# Patient Record
Sex: Male | Born: 1965
Health system: Southern US, Community
[De-identification: ages and names within clinical notes are randomized; demographics above are authoritative.]

## PROBLEM LIST (undated history)

## (undated) DIAGNOSIS — K219 Gastro-esophageal reflux disease without esophagitis: Secondary | ICD-10-CM

## (undated) DIAGNOSIS — Z889 Allergy status to unspecified drugs, medicaments and biological substances status: Secondary | ICD-10-CM

## (undated) HISTORY — DX: Allergy status to unspecified drugs, medicaments and biological substances: Z88.9

## (undated) HISTORY — DX: Gastro-esophageal reflux disease without esophagitis: K21.9

## (undated) HISTORY — PX: WISDOM TOOTH EXTRACTION: SHX21

---

## 2000-08-14 ENCOUNTER — Encounter: Payer: Self-pay | Admitting: Orthopedic Surgery

## 2000-08-14 ENCOUNTER — Ambulatory Visit (HOSPITAL_COMMUNITY): Admission: RE | Admit: 2000-08-14 | Discharge: 2000-08-14 | Payer: Self-pay | Admitting: Orthopedic Surgery

## 2004-02-22 ENCOUNTER — Ambulatory Visit: Payer: Self-pay | Admitting: Family Medicine

## 2005-04-23 ENCOUNTER — Ambulatory Visit: Payer: Self-pay | Admitting: Family Medicine

## 2005-05-05 ENCOUNTER — Ambulatory Visit: Payer: Self-pay | Admitting: Family Medicine

## 2006-02-12 ENCOUNTER — Ambulatory Visit: Payer: Self-pay | Admitting: Family Medicine

## 2006-06-14 ENCOUNTER — Ambulatory Visit: Payer: Self-pay | Admitting: Family Medicine

## 2007-01-28 ENCOUNTER — Ambulatory Visit: Payer: Self-pay | Admitting: Family Medicine

## 2007-03-30 ENCOUNTER — Ambulatory Visit: Payer: Self-pay | Admitting: Family Medicine

## 2007-03-30 LAB — CONVERTED CEMR LAB
Beta hcg, urine, semiquantitative: NEGATIVE
Bilirubin Urine: NEGATIVE
Blood in Urine, dipstick: NEGATIVE
Glucose, Urine, Semiquant: NEGATIVE
Ketones, urine, test strip: NEGATIVE
Nitrite: NEGATIVE
Protein, U semiquant: NEGATIVE
Specific Gravity, Urine: 1.025
Urobilinogen, UA: NEGATIVE
WBC Urine, dipstick: NEGATIVE
pH: 6

## 2007-04-01 LAB — CONVERTED CEMR LAB
ALT: 31 units/L (ref 0–53)
AST: 23 units/L (ref 0–37)
Albumin: 4 g/dL (ref 3.5–5.2)
Alkaline Phosphatase: 61 units/L (ref 39–117)
BUN: 12 mg/dL (ref 6–23)
Basophils Absolute: 0 10*3/uL (ref 0.0–0.1)
Basophils Relative: 0.2 % (ref 0.0–1.0)
Bilirubin, Direct: 0.2 mg/dL (ref 0.0–0.3)
CO2: 32 meq/L (ref 19–32)
Calcium: 9.6 mg/dL (ref 8.4–10.5)
Chloride: 104 meq/L (ref 96–112)
Cholesterol: 194 mg/dL (ref 0–200)
Creatinine, Ser: 1.1 mg/dL (ref 0.4–1.5)
Eosinophils Absolute: 0.3 10*3/uL (ref 0.0–0.6)
Eosinophils Relative: 5 % (ref 0.0–5.0)
GFR calc Af Amer: 94 mL/min
GFR calc non Af Amer: 78 mL/min
Glucose, Bld: 114 mg/dL — ABNORMAL HIGH (ref 70–99)
HCT: 45.2 % (ref 39.0–52.0)
HDL: 58.8 mg/dL (ref >39.0)
Hemoglobin: 15.5 g/dL (ref 13.0–17.0)
LDL Cholesterol: 122 mg/dL — ABNORMAL HIGH (ref 0–99)
Lymphocytes Relative: 31.7 % (ref 12.0–46.0)
MCHC: 34.3 g/dL (ref 30.0–36.0)
MCV: 89.7 fL (ref 78.0–100.0)
Monocytes Absolute: 0.4 10*3/uL (ref 0.2–0.7)
Monocytes Relative: 6.9 % (ref 3.0–11.0)
Neutro Abs: 2.8 10*3/uL (ref 1.4–7.7)
Neutrophils Relative %: 56.2 % (ref 43.0–77.0)
Platelets: 216 10*3/uL (ref 150–400)
Potassium: 4.8 meq/L (ref 3.5–5.1)
RBC: 5.04 M/uL (ref 4.22–5.81)
RDW: 12.2 % (ref 11.5–14.6)
Sodium: 141 meq/L (ref 135–145)
TSH: 1.29 microintl units/mL (ref 0.35–5.50)
Total Bilirubin: 0.7 mg/dL (ref 0.3–1.2)
Total CHOL/HDL Ratio: 3.3
Total Protein: 6.7 g/dL (ref 6.0–8.3)
Triglycerides: 68 mg/dL (ref 0–149)
VLDL: 14 mg/dL (ref 0–40)
WBC: 5.1 10*3/uL (ref 4.5–10.5)

## 2007-05-10 ENCOUNTER — Ambulatory Visit: Payer: Self-pay | Admitting: Family Medicine

## 2007-05-10 DIAGNOSIS — J189 Pneumonia, unspecified organism: Secondary | ICD-10-CM

## 2007-05-10 DIAGNOSIS — F329 Major depressive disorder, single episode, unspecified: Secondary | ICD-10-CM | POA: Insufficient documentation

## 2009-04-23 ENCOUNTER — Ambulatory Visit: Payer: Self-pay | Admitting: Family Medicine

## 2009-04-23 DIAGNOSIS — J069 Acute upper respiratory infection, unspecified: Secondary | ICD-10-CM

## 2009-04-23 DIAGNOSIS — J029 Acute pharyngitis, unspecified: Secondary | ICD-10-CM

## 2010-04-29 NOTE — Assessment & Plan Note (Signed)
Summary: SORE THROAT (CONCERNED ABOUT STREP) // RS   Vital Signs:  Patient profile:   45 year old male Weight:      271 pounds BMI:     41.35 Temp:     98.0 degrees F oral Pulse rate:   95 / minute BP sitting:   134 / 88  (left arm) Cuff size:   large  Vitals Entered By: Alfred Levins, CMA (April 23, 2009 9:46 AM) CC: st x2 days   Current Medications (verified): 1)  Zyrtec Allergy 10 Mg  Tabs (Cetirizine Hcl) .Marland Kitchen.. 1 Once Daily Prn 2)  Prilosec Otc 20 Mg Tbec (Omeprazole Magnesium) .... One By Mouth Daily  Allergies (verified): No Known Drug Allergies   Complete Medication List: 1)  Zyrtec Allergy 10 Mg Tabs (Cetirizine hcl) .Marland Kitchen.. 1 once daily prn 2)  Prilosec Otc 20 Mg Tbec (Omeprazole magnesium) .... One by mouth daily  Other Orders: Rapid Strep (16109)  Appended Document: SORE THROAT (CONCERNED ABOUT STREP) // RS     History of Present Illness: Here for 3 days of PND and a ST. The ST was worse the first day, then got a lot better. No fever or cough. On fluids and Motrin.   Allergies: No Known Drug Allergies  Past History:  Past Medical History: Reviewed history from 05/10/2007 and no changes required. Hx of pneumonia Mortons neuroma insomnia Depression  Review of Systems  The patient denies anorexia, fever, weight loss, weight gain, vision loss, decreased hearing, hoarseness, chest pain, syncope, dyspnea on exertion, peripheral edema, prolonged cough, headaches, hemoptysis, abdominal pain, melena, hematochezia, severe indigestion/heartburn, hematuria, incontinence, genital sores, muscle weakness, suspicious skin lesions, transient blindness, difficulty walking, depression, unusual weight change, abnormal bleeding, enlarged lymph nodes, angioedema, breast masses, and testicular masses.    Physical Exam  General:  Well-developed,well-nourished,in no acute distress; alert,appropriate and cooperative throughout examination Head:  Normocephalic and atraumatic  without obvious abnormalities. No apparent alopecia or balding. Eyes:  No corneal or conjunctival inflammation noted. EOMI. Perrla. Funduscopic exam benign, without hemorrhages, exudates or papilledema. Vision grossly normal. Ears:  External ear exam shows no significant lesions or deformities.  Otoscopic examination reveals clear canals, tympanic membranes are intact bilaterally without bulging, retraction, inflammation or discharge. Hearing is grossly normal bilaterally. Nose:  External nasal examination shows no deformity or inflammation. Nasal mucosa are pink and moist without lesions or exudates. Mouth:  Oral mucosa and oropharynx without lesions or exudates.  Teeth in good repair. Neck:  No deformities, masses, or tenderness noted. Lungs:  Normal respiratory effort, chest expands symmetrically. Lungs are clear to auscultation, no crackles or wheezes.   Impression & Recommendations:  Problem # 1:  VIRAL URI (ICD-465.9)  His updated medication list for this problem includes:    Zyrtec Allergy 10 Mg Tabs (Cetirizine hcl) .Marland Kitchen... 1 once daily prn  Orders: Rapid Strep (60454)  Complete Medication List: 1)  Zyrtec Allergy 10 Mg Tabs (Cetirizine hcl) .Marland Kitchen.. 1 once daily prn 2)  Prilosec Otc 20 Mg Tbec (Omeprazole magnesium) .... One by mouth daily  Patient Instructions: 1)  Drink fluids, use Advil as needed . 2)  Please schedule a follow-up appointment as needed .   Laboratory Results  Date/Time Received: April 23, 2009 10:43 AM Date/Time Reported: April 23, 2009 10:43 AM  Other Tests  Rapid Strep: negative Comments: Alfred Levins, CMA  April 23, 2009 10:43 AM

## 2013-01-20 ENCOUNTER — Emergency Department (HOSPITAL_BASED_OUTPATIENT_CLINIC_OR_DEPARTMENT_OTHER)
Admission: EM | Admit: 2013-01-20 | Discharge: 2013-01-20 | Disposition: A | Discharge status: Home / Self Care | Payer: Self-pay | Attending: Emergency Medicine | Admitting: Emergency Medicine

## 2013-01-20 ENCOUNTER — Encounter (HOSPITAL_BASED_OUTPATIENT_CLINIC_OR_DEPARTMENT_OTHER): Payer: Self-pay | Admitting: Emergency Medicine

## 2013-01-20 ENCOUNTER — Emergency Department (HOSPITAL_BASED_OUTPATIENT_CLINIC_OR_DEPARTMENT_OTHER): Payer: Self-pay

## 2013-01-20 DIAGNOSIS — Y9301 Activity, walking, marching and hiking: Secondary | ICD-10-CM | POA: Insufficient documentation

## 2013-01-20 DIAGNOSIS — Y929 Unspecified place or not applicable: Secondary | ICD-10-CM | POA: Insufficient documentation

## 2013-01-20 DIAGNOSIS — S93401A Sprain of unspecified ligament of right ankle, initial encounter: Secondary | ICD-10-CM

## 2013-01-20 DIAGNOSIS — W010XXA Fall on same level from slipping, tripping and stumbling without subsequent striking against object, initial encounter: Secondary | ICD-10-CM | POA: Insufficient documentation

## 2013-01-20 DIAGNOSIS — X500XXA Overexertion from strenuous movement or load, initial encounter: Secondary | ICD-10-CM | POA: Insufficient documentation

## 2013-01-20 DIAGNOSIS — S93409A Sprain of unspecified ligament of unspecified ankle, initial encounter: Secondary | ICD-10-CM | POA: Insufficient documentation

## 2013-01-20 NOTE — ED Provider Notes (Signed)
CSN: [id number]     Arrival date & time 01/20/13  2043 History   First MD Initiated Contact with Patient 01/20/13 2110     Chief Complaint  Patient presents with  . Ankle Injury   (Consider location/radiation/quality/duration/timing/severity/associated sxs/prior Treatment) HPI Comments: 47 year old male presents to the emergency department complaining of right ankle pain x1 day. Patient states yesterday he was walking and slipped over a piece of metal causing his right ankle 2 with fall, he heard a pop, and since then has been in pain. Pain rated 4/10. He has been icing and elevating it. It ankle has been swelling increasingly over the past day. Denies numbness or tingling.  Patient is a 47 y.o. male presenting with lower extremity injury. The history is provided by the patient.  Ankle Injury Associated symptoms include joint swelling.    History reviewed. No pertinent past medical history. History reviewed. No pertinent past surgical history. No family history on file. History  Substance Use Topics  . Smoking status: Never Smoker   . Smokeless tobacco: Not on file  . Alcohol Use: No    Review of Systems  Musculoskeletal: Positive for joint swelling.       Positive for right ankle pain.  All other systems reviewed and are negative.    Allergies  Review of patient's allergies indicates no known allergies.  Home Medications  No current outpatient prescriptions on file. BP 158/85  Pulse 78  Temp(Src) 98.4 F (36.9 C) (Oral)  Resp 18  Wt 271 lb (122.925 kg)  BMI 41.22 kg/m2  SpO2 99% Physical Exam  Nursing note and vitals reviewed. Constitutional: He is oriented to person, place, and time. He appears well-developed and well-nourished. No distress.  HENT:  Head: Normocephalic and atraumatic.  Mouth/Throat: Oropharynx is clear and moist.  Eyes: Conjunctivae are normal.  Neck: Normal range of motion. Neck supple.  Cardiovascular: Normal rate, regular rhythm and normal  heart sounds.   +2 DP and PT pulses on right.  Pulmonary/Chest: Effort normal and breath sounds normal.  Musculoskeletal:  TTP of lateral aspect of right ankle with swelling. ROM limited due to pain. No bruising. No tenderness to base of 5th metatarsal. Cap refill < 3 seconds. No tenderness to achilles tendon.  Neurological: He is alert and oriented to person, place, and time.  Sensation intact.  Skin: Skin is warm and dry. He is not diaphoretic.  Psychiatric: He has a normal mood and affect. His behavior is normal.    ED Course  Procedures (including critical care time) Labs Review Labs Reviewed - No data to display Imaging Review Dg Ankle Complete Right  01/20/2013   CLINICAL DATA:  Right ankle pain and swelling after injury.  EXAM: RIGHT ANKLE - COMPLETE 3+ VIEW  COMPARISON:  None.  FINDINGS: There is no evidence of fracture, dislocation, or joint effusion. There is no evidence of arthropathy or other focal bone abnormality. Soft tissues are unremarkable.  IMPRESSION: Negative.   Electronically Signed   By: Roque Lias M.D.   On: 01/20/2013 21:52    EKG Interpretation   None       MDM   1. Ankle sprain, right, initial encounter     Patient with ankle sprain. Xray result without any acute abnormality, also reviewed by me. ACE wrap applied, crutches given. NSAIDs for pain. RICE. F/u with PCP. Return precautions discussed. Patient states understanding of plan and is agreeable.   Trevor Mace, PA-C 01/20/13 2157

## 2013-01-20 NOTE — ED Notes (Signed)
Right ankle injury yesterday. He has been using ice ace and elevation.

## 2013-01-21 NOTE — ED Provider Notes (Signed)
Medical screening examination/treatment/procedure(s) were performed by non-physician practitioner and as supervising physician I was immediately available for consultation/collaboration.  EKG Interpretation   None         Kamilla Hands, MD 01/21/13 0903 

## 2015-03-12 ENCOUNTER — Encounter: Payer: Self-pay | Admitting: Family Medicine

## 2015-03-12 ENCOUNTER — Ambulatory Visit (INDEPENDENT_AMBULATORY_CARE_PROVIDER_SITE_OTHER): Payer: 59 | Admitting: Family Medicine

## 2015-03-12 VITALS — BP 157/88 | HR 97 | Temp 99.9°F | Ht 68.0 in | Wt 265.0 lb

## 2015-03-12 DIAGNOSIS — J209 Acute bronchitis, unspecified: Secondary | ICD-10-CM | POA: Diagnosis not present

## 2015-03-12 MED ORDER — AZITHROMYCIN 250 MG PO TABS: ORAL_TABLET | ORAL | Status: DC

## 2015-03-12 MED ORDER — HYDROCODONE-HOMATROPINE 5-1.5 MG/5ML PO SYRP: 5.0000 mL | ORAL_SOLUTION | ORAL | Status: DC | PRN

## 2015-03-12 NOTE — Progress Notes (Signed)
   Subjective:    Patient ID: Benjamin Cunningham, male    DOB: Dec 01, 1965, 49 y.o.   MRN: [id number]  HPI Here for 3 days of fevers, chest congestion, and a dry cough. Using Dayquil and Nyquil with poor results. No NVD.    Review of Systems  Constitutional: Positive for fever.  HENT: Positive for congestion and postnasal drip. Negative for ear pain, sinus pressure and sore throat.   Eyes: Negative.   Respiratory: Positive for cough and chest tightness.   Cardiovascular: Negative.        Objective:   Physical Exam  Constitutional: He appears well-developed and well-nourished. No distress.  HENT:  Right Ear: External ear normal.  Left Ear: External ear normal.  Nose: Nose normal.  Mouth/Throat: Oropharynx is clear and moist.  Eyes: Conjunctivae are normal.  Neck: No thyromegaly present.  Cardiovascular: Normal rate, regular rhythm, normal heart sounds and intact distal pulses.   Pulmonary/Chest: Effort normal. No respiratory distress. He has no wheezes. He has no rales.  Scattered rhonchi  Lymphadenopathy:    He has no cervical adenopathy.          Assessment & Plan:  Bronchitis, treat with a Zpack

## 2015-03-12 NOTE — Progress Notes (Signed)
Pre visit review using our clinic review tool, if applicable. No additional management support is needed unless otherwise documented below in the visit note. 

## 2015-03-13 ENCOUNTER — Encounter: Payer: Self-pay | Admitting: Family Medicine

## 2015-10-21 DIAGNOSIS — S20212A Contusion of left front wall of thorax, initial encounter: Secondary | ICD-10-CM | POA: Diagnosis not present

## 2015-10-21 DIAGNOSIS — R079 Chest pain, unspecified: Secondary | ICD-10-CM | POA: Diagnosis not present

## 2016-04-07 ENCOUNTER — Other Ambulatory Visit: Payer: Self-pay

## 2016-04-08 ENCOUNTER — Other Ambulatory Visit (INDEPENDENT_AMBULATORY_CARE_PROVIDER_SITE_OTHER): Payer: BLUE CROSS/BLUE SHIELD

## 2016-04-08 DIAGNOSIS — Z0000 Encounter for general adult medical examination without abnormal findings: Secondary | ICD-10-CM | POA: Diagnosis not present

## 2016-04-08 LAB — POC URINALSYSI DIPSTICK (AUTOMATED)
BILIRUBIN UA: NEGATIVE
Glucose, UA: NEGATIVE
Ketones, UA: NEGATIVE
LEUKOCYTES UA: NEGATIVE
NITRITE UA: NEGATIVE
PH UA: 5.5
PROTEIN UA: NEGATIVE
RBC UA: NEGATIVE
Spec Grav, UA: 1.02
UROBILINOGEN UA: 0.2

## 2016-04-08 LAB — LIPID PANEL
CHOL/HDL RATIO: 3
Cholesterol: 201 mg/dL — ABNORMAL HIGH (ref 0–200)
HDL: 64.7 mg/dL (ref >39.00)
LDL CALC: 123 mg/dL — AB (ref 0–99)
NonHDL: 135.82
TRIGLYCERIDES: 66 mg/dL (ref 0.0–149.0)
VLDL: 13.2 mg/dL (ref 0.0–40.0)

## 2016-04-08 LAB — CBC WITH DIFFERENTIAL/PLATELET
BASOS ABS: 0 10*3/uL (ref 0.0–0.1)
BASOS PCT: 0.5 % (ref 0.0–3.0)
EOS ABS: 0.3 10*3/uL (ref 0.0–0.7)
Eosinophils Relative: 3.5 % (ref 0.0–5.0)
HCT: 44.9 % (ref 39.0–52.0)
HEMOGLOBIN: 15.4 g/dL (ref 13.0–17.0)
Lymphocytes Relative: 19.1 % (ref 12.0–46.0)
Lymphs Abs: 1.7 10*3/uL (ref 0.7–4.0)
MCHC: 34.3 g/dL (ref 30.0–36.0)
MCV: 87.7 fl (ref 78.0–100.0)
MONO ABS: 0.5 10*3/uL (ref 0.1–1.0)
Monocytes Relative: 5.5 % (ref 3.0–12.0)
NEUTROS ABS: 6.2 10*3/uL (ref 1.4–7.7)
Neutrophils Relative %: 71.4 % (ref 43.0–77.0)
PLATELETS: 264 10*3/uL (ref 150.0–400.0)
RBC: 5.12 Mil/uL (ref 4.22–5.81)
RDW: 13.1 % (ref 11.5–15.5)
WBC: 8.7 10*3/uL (ref 4.0–10.5)

## 2016-04-08 LAB — HEPATIC FUNCTION PANEL
ALT: 40 U/L (ref 0–53)
AST: 24 U/L (ref 0–37)
Albumin: 4.1 g/dL (ref 3.5–5.2)
Alkaline Phosphatase: 72 U/L (ref 39–117)
BILIRUBIN DIRECT: 0.1 mg/dL (ref 0.0–0.3)
BILIRUBIN TOTAL: 0.4 mg/dL (ref 0.2–1.2)
Total Protein: 7.1 g/dL (ref 6.0–8.3)

## 2016-04-08 LAB — BASIC METABOLIC PANEL
BUN: 18 mg/dL (ref 6–23)
CALCIUM: 9.8 mg/dL (ref 8.4–10.5)
CHLORIDE: 102 meq/L (ref 96–112)
CO2: 29 mEq/L (ref 19–32)
CREATININE: 0.97 mg/dL (ref 0.40–1.50)
GFR: 86.56 mL/min (ref >60.00)
Glucose, Bld: 98 mg/dL (ref 70–99)
Potassium: 4.8 mEq/L (ref 3.5–5.1)
Sodium: 142 mEq/L (ref 135–145)

## 2016-04-08 LAB — PSA: PSA: 0.54 ng/mL (ref 0.10–4.00)

## 2016-04-08 LAB — TSH: TSH: 1.07 u[IU]/mL (ref 0.35–4.50)

## 2016-04-14 ENCOUNTER — Encounter: Payer: Self-pay | Admitting: Family Medicine

## 2016-04-14 ENCOUNTER — Ambulatory Visit (INDEPENDENT_AMBULATORY_CARE_PROVIDER_SITE_OTHER): Payer: BLUE CROSS/BLUE SHIELD | Admitting: Family Medicine

## 2016-04-14 VITALS — BP 137/83 | HR 85 | Temp 98.2°F | Ht 68.0 in | Wt 272.0 lb

## 2016-04-14 DIAGNOSIS — Z0001 Encounter for general adult medical examination with abnormal findings: Secondary | ICD-10-CM | POA: Diagnosis not present

## 2016-04-14 DIAGNOSIS — Z0000 Encounter for general adult medical examination without abnormal findings: Secondary | ICD-10-CM

## 2016-04-14 DIAGNOSIS — E6609 Other obesity due to excess calories: Secondary | ICD-10-CM | POA: Diagnosis not present

## 2016-04-14 DIAGNOSIS — G4733 Obstructive sleep apnea (adult) (pediatric): Secondary | ICD-10-CM

## 2016-04-14 DIAGNOSIS — M25562 Pain in left knee: Secondary | ICD-10-CM

## 2016-04-14 DIAGNOSIS — E782 Mixed hyperlipidemia: Secondary | ICD-10-CM

## 2016-04-14 MED ORDER — OMEPRAZOLE 40 MG PO CPDR: 40.0000 mg | DELAYED_RELEASE_CAPSULE | Freq: Every day | ORAL | 3 refills | Status: DC

## 2016-04-14 NOTE — Progress Notes (Signed)
Pre visit review using our clinic review tool, if applicable. No additional management support is needed unless otherwise documented below in the visit note. 

## 2016-04-14 NOTE — Progress Notes (Signed)
   Subjective:    Patient ID: Benjamin Cunningham, male    DOB: 11-Jul-1965, 51 y.o.   MRN: [id number]  HPI 51 yr old male for a well exam. He has a few issues to discuss. First he has had pain in the left knee for 6 months or so. No swelling or locking or giving way. No hx of trauma. Most of his pain is on squatting or going up or down steps. He has used Ibuprofen with poor results. Second he has been using OTC Omeprazole 20 mg daily for GERD but this has not been effective. He often has breakhtrough heartburn. No trouble swallowing. Third he thinks he may have sleep apnea. His wife says he snores a lot and he often seems to struggle to breathe in is sleep. He does have a family hx of sleep apnea. Lastly he asks for help with his weight. He has tried to eat a better diet but he has little information to go on. He gets little exercise other than work.    Review of Systems  Constitutional: Negative.   HENT: Negative.   Eyes: Negative.   Respiratory: Negative.   Cardiovascular: Negative.   Gastrointestinal: Negative.   Genitourinary: Negative.   Musculoskeletal: Positive for arthralgias. Negative for back pain, gait problem, joint swelling, myalgias, neck pain and neck stiffness.  Skin: Negative.   Neurological: Negative.   Psychiatric/Behavioral: Negative.        Objective:   Physical Exam  Constitutional: He is oriented to person, place, and time. He appears well-developed and well-nourished. No distress.  HENT:  Head: Normocephalic and atraumatic.  Right Ear: External ear normal.  Left Ear: External ear normal.  Nose: Nose normal.  Mouth/Throat: Oropharynx is clear and moist. No oropharyngeal exudate.  Eyes: Conjunctivae and EOM are normal. Pupils are equal, round, and reactive to light. Right eye exhibits no discharge. Left eye exhibits no discharge. No scleral icterus.  Neck: Neck supple. No JVD present. No tracheal deviation present. No thyromegaly present.  Cardiovascular: Normal  rate, regular rhythm, normal heart sounds and intact distal pulses.  Exam reveals no gallop and no friction rub.   No murmur heard. Pulmonary/Chest: Effort normal and breath sounds normal. No respiratory distress. He has no wheezes. He has no rales. He exhibits no tenderness.  Abdominal: Soft. Bowel sounds are normal. He exhibits no distension and no mass. There is no tenderness. There is no rebound and no guarding.  Genitourinary: Rectum normal, prostate normal and penis normal. Rectal exam shows guaiac negative stool. No penile tenderness.  Musculoskeletal: Normal range of motion. He exhibits no edema or tenderness.  Lymphadenopathy:    He has no cervical adenopathy.  Neurological: He is alert and oriented to person, place, and time. He has normal reflexes. No cranial nerve deficit. He exhibits normal muscle tone. Coordination normal.  Skin: Skin is warm and dry. No rash noted. He is not diaphoretic. No erythema. No pallor.  Psychiatric: He has a normal mood and affect. His behavior is normal. Judgment and thought content normal.          Assessment & Plan:  Well exam. We discussed diet and exercise. He is overweight and his elevated lipid levels, so we will refer him to Nutrition to help with these problems. Refer to Orthopedics for the knee pain. Refer for a sleep apnea evaluation. Increase  Omeprazole to 40 mg daily. Set up a colonoscopy.  Alysia Penna, MD

## 2016-04-21 ENCOUNTER — Encounter: Payer: Self-pay | Admitting: Gastroenterology

## 2016-04-29 DIAGNOSIS — M222X2 Patellofemoral disorders, left knee: Secondary | ICD-10-CM | POA: Diagnosis not present

## 2016-04-29 DIAGNOSIS — S8992XS Unspecified injury of left lower leg, sequela: Secondary | ICD-10-CM | POA: Diagnosis not present

## 2016-04-30 ENCOUNTER — Ambulatory Visit (INDEPENDENT_AMBULATORY_CARE_PROVIDER_SITE_OTHER): Payer: BLUE CROSS/BLUE SHIELD | Admitting: Internal Medicine

## 2016-04-30 ENCOUNTER — Telehealth: Payer: Self-pay | Admitting: *Deleted

## 2016-04-30 ENCOUNTER — Encounter: Payer: Self-pay | Admitting: Internal Medicine

## 2016-04-30 VITALS — BP 122/82 | HR 80 | Ht 68.0 in | Wt 275.4 lb

## 2016-04-30 DIAGNOSIS — G4733 Obstructive sleep apnea (adult) (pediatric): Secondary | ICD-10-CM | POA: Diagnosis not present

## 2016-04-30 NOTE — Telephone Encounter (Addendum)
-----   Message from Deneise Lever, MD sent at 04/30/2016  4:21 PM EST ----- Please change order to unattended Home Sleep tEst for dx OSA  ----- Message ----- From: Joellen Jersey Sent: 04/30/2016   2:40 PM To: Lorane Gell, CMA, Deneise Lever, MD, #  [facility] denied the split night but authorized home sleep study I will need an order for hst

## 2016-04-30 NOTE — Progress Notes (Addendum)
Subjective:    Patient ID: Benjamin Cunningham, male    DOB: [birth year], 51 y.o.   MRN: [id number]  HPI 04/30/2016-51 year old male for sleep medicine consult. Referred by Dr Sarajane Jews. Epworth Score: 8 Medical problems include history depression, GERD. Wife has told him he snores a lot and seems to struggle to breathe in his sleep. Positive family history for OSA. Wife tells him he is very restless, rolling around in bed but without description of leg jerking. Sleep is fragmented and he doesn't feel rested, often sleepy during the day. No history of ENT surgery, lung or respiratory, or heart disease. Father and brother are both on CPAP for OSA. 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 He describes comfortable bedroom.   Prior to Admission medications   Medication Sig Start Date End Date Taking? Authorizing Provider  Multiple Vitamins-Minerals (MULTIVITAMIN WITH MINERALS) tablet Take 1 tablet by mouth daily.   Yes Historical Provider, MD  omeprazole (PRILOSEC) 40 MG capsule Take 1 capsule (40 mg total) by mouth daily. 04/14/16  Yes Laurey Morale, MD   History reviewed. No pertinent past medical history.  Past Surgical History:  Procedure Laterality Date  . WISDOM TOOTH EXTRACTION     around early 60s   Family History  Problem Relation Age of Onset  . Cervical cancer Mother   . Breast cancer Maternal Grandmother   . Liver cancer Maternal Grandmother    Social History   Social History  . Marital status: Married    Spouse name: N/A  . Number of children: N/A  . Years of education: N/A   Occupational History  . VP of Operations    Social History Main Topics  . Smoking status: Never Smoker  . Smokeless tobacco: Never Used  . Alcohol use Yes     Comment: 1-2 drinks per month  . Drug use: No  . Sexual activity: Not on file   Other Topics Concern  . Not on file   Social History Narrative  . No  narrative on file    Review of Systems  Constitutional: Negative for fever and unexpected weight change.  HENT: Negative for congestion, dental problem, ear pain, nosebleeds, postnasal drip, rhinorrhea, sinus pressure, sneezing, sore throat and trouble swallowing.   Eyes: Negative for redness and itching.  Respiratory: Negative for cough, chest tightness, shortness of breath and wheezing.   Cardiovascular: Negative for palpitations and leg swelling.  Gastrointestinal: Negative for nausea and vomiting.       Acid heartburn  Genitourinary: Negative for dysuria.  Musculoskeletal: Negative for joint swelling.  Skin: Negative for rash.  Neurological: Negative for headaches.  Hematological: Does not bruise/bleed easily.  Psychiatric/Behavioral: Negative for dysphoric mood. The patient is not nervous/anxious.        Objective:   Physical Exam  OBJ- Physical Exam General- Alert, Oriented, Affect-appropriate, Distress- none acute, +heavy set/ over weight Skin- rash-none, lesions- none, excoriation- none Lymphadenopathy- none Head- atraumatic            Eyes- Gross vision intact, PERRLA, conjunctivae and secretions clear            Ears- Hearing, canals-normal            Nose- Clear, no-Septal dev, mucus, polyps, erosion, perforation             Throat- Mallampati III , mucosa clear , drainage- none, tonsils- atrophic Neck- flexible , trachea midline, no stridor ,  thyroid nl, carotid no bruit Chest - symmetrical excursion , unlabored           Heart/CV- RRR , no murmur , no gallop  , no rub, nl s1 s2                           - JVD- none , edema- none, stasis changes- none, varices- none           Lung- clear to P&A, wheeze- none, cough- none , dullness-none, rub- none           Chest wall-  Abd-  Br/ Gen/ Rectal- Not done, not indicated Extrem- cyanosis- none, clubbing, none, atrophy- none, strength- nl Neuro- grossly intact to observation      Assessment & Plan:

## 2016-04-30 NOTE — Progress Notes (Signed)
04/30/2016-51 year old male for sleep medicine consult. Referred by Dr Sarajane Jews. Epworth Score: 8 Medical problems include history depression, GERD. Wife has told him he snores a lot and seems to struggle to breathe in his sleep. Positive family history for OSA. Wife tells him he is very restless, rolling around in bed but without description of leg jerking. Sleep is fragmented and he doesn't feel rested, often sleepy during the day. No history of ENT surgery, lung or respiratory, or heart disease. Father and brother are both on CPAP for OSA. 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 He describes comfortable bedroom.  Prior to Admission medications   Medication Sig Start Date End Date Taking? Authorizing Provider  Multiple Vitamins-Minerals (MULTIVITAMIN WITH MINERALS) tablet Take 1 tablet by mouth daily.   Yes Historical Provider, MD  omeprazole (PRILOSEC) 40 MG capsule Take 1 capsule (40 mg total) by mouth daily. 04/14/16  Yes Laurey Morale, MD   History reviewed. No pertinent past medical history. Past Surgical History:  Procedure Laterality Date  . WISDOM TOOTH EXTRACTION     around early 51s   Family History  Problem Relation Age of Onset  . Cervical cancer Mother   . Breast cancer Maternal Grandmother   . Liver cancer Maternal Grandmother    Social History   Social History  . Marital status: Married    Spouse name: N/A  . Number of children: N/A  . Years of education: N/A   Occupational History  . VP of Operations    Social History Main Topics  . Smoking status: Never Smoker  . Smokeless tobacco: Never Used  . Alcohol use Yes     Comment: 1-2 drinks per month  . Drug use: No  . Sexual activity: Not on file   Other Topics Concern  . Not on file   Social History Narrative  . No narrative on file   ROS-see HPI   Negative unless "+" Constitutional:    weight loss, night sweats, fevers,  chills, +fatigue, lassitude. HEENT:    headaches, difficulty swallowing, tooth/dental problems, sore throat,       sneezing, itching, ear ache, nasal congestion, post nasal drip, snoring CV:    chest pain, orthopnea, PND, swelling in lower extremities, anasarca,                                                        izziness, palpitations Resp:   shortness of breath with exertion or at rest.                productive cough,   non-productive cough, coughing up of blood.              change in color of mucus.  wheezing.   Skin:    rash or lesions. GI:  No-   heartburn, indigestion, abdominal pain, nausea, vomiting, diarrhea,                 change in bowel habits, loss of appetite GU: dysuria, change in color of urine, no urgency or frequency.   flank pain. MS:   joint pain, stiffness, decreased range of motion, back pain. Neuro-     nothing unusual Psych:  change in mood or affect.  depression or anxiety.  memory loss.  OBJ- Physical Exam General- Alert, Oriented, Affect-appropriate, Distress- none acute, +heavy set/ over weight Skin- rash-none, lesions- none, excoriation- none Lymphadenopathy- none Head- atraumatic            Eyes- Gross vision intact, PERRLA, conjunctivae and secretions clear            Ears- Hearing, canals-normal            Nose- Clear, no-Septal dev, mucus, polyps, erosion, perforation             Throat- Mallampati III , mucosa clear , drainage- none, tonsils- atrophic Neck- flexible , trachea midline, no stridor , thyroid nl, carotid no bruit Chest - symmetrical excursion , unlabored           Heart/CV- RRR , no murmur , no gallop  , no rub, nl s1 s2                           - JVD- none , edema- none, stasis changes- none, varices- none           Lung- clear to P&A, wheeze- none, cough- none , dullness-none, rub- none           Chest wall-  Abd-  Br/ Gen/ Rectal- Not done, not indicated Extrem- cyanosis- none, clubbing, none, atrophy- none, strength-  nl Neuro- grossly intact to observation

## 2016-04-30 NOTE — Assessment & Plan Note (Addendum)
High probability based on family history, personal history and physical exam. He describes himself as physically restless at night. It is not clear if this is limb movement sleep disturbance or simply arousals related to obstructive apnea. Basic sleep hygiene and physiology of OSA discussed. His responsibility to drive safely was emphasized. Plan-schedule split protocol polysomnogram

## 2016-04-30 NOTE — Patient Instructions (Signed)
Order- schedule split protocol NPSG    Dx OSA  We will work with you to get you back to review the study after it is done.

## 2016-04-30 NOTE — Telephone Encounter (Signed)
Order placed for HST Will send to [facility] to ensure this gets scheduled.

## 2016-05-06 DIAGNOSIS — M222X2 Patellofemoral disorders, left knee: Secondary | ICD-10-CM | POA: Diagnosis not present

## 2016-05-13 DIAGNOSIS — M222X2 Patellofemoral disorders, left knee: Secondary | ICD-10-CM | POA: Diagnosis not present

## 2016-05-19 ENCOUNTER — Encounter: Payer: BLUE CROSS/BLUE SHIELD | Attending: Family Medicine | Admitting: Registered"

## 2016-05-19 DIAGNOSIS — Z713 Dietary counseling and surveillance: Secondary | ICD-10-CM | POA: Insufficient documentation

## 2016-05-19 NOTE — Progress Notes (Signed)
Medical Nutrition Therapy:  Appt start time: 0800 end time:  0845.   Assessment:  Primary concerns today: Weight management and overall health. This patient is accompanied in the office by his spouse. Patient reports that the spouse does the cooking. They have a good foundation of understanding how to eat healthy. Just need some tweaks to their current eating habits.  Preferred Learning Style:  Visual  Hands on  Learning Readiness:   Ready  MEDICATIONS: reviewed   DIETARY INTAKE:  Usual eating pattern includes 3 meals and 0 snacks per day.  24-hr recall:  B ( AM): english muffin with egg and cheese OR oatmeal Or bagel & cream cheese  Snk ( AM): none  L ( PM): sandwich with deli meat, veggies, cottage cheese, cheese stick, fruit Snk ( PM): cheese stick, vegetable D ( PM): vegetable, starch, and protein Snk ( PM): none OR ice cream or cookie Beverages: coffee cream & splenda, cream  Usual physical activity: minimal due to knee pain, spends a lot of time driving  Estimated energy needs: 2000 calories 225 g carbohydrates 150 g protein 56 g fat  Progress Towards Goal(s):  In progress.   Nutritional Diagnosis:  [place]-3.3 Overweight/obesity As related to excess calories.  As evidenced by diet recall and BMI of 42.    Intervention:  Nutrition Education. Reviewed food groups, and portion control. Discussed the importance of including a variety of fruits and vegetables for overall health.  Plan: Saturated fat: look for American Heart Association heart check options. Consider using low-fat or fat free dairy and [place] yogurt. Consider including more berries, walnuts and almonds in your diet Consider dishing up 20% less than what you think you want at dinner Consider taking 20 min or more for dinner and eat slow, chew food well to help with GERD. Consider doing chair exercises 15 min a day as tolerated  Teaching Method Utilized:  Visual Auditory  Handouts given during visit  include:  My Plate  Arm chair  Barriers to learning/adherence to lifestyle change: none  Demonstrated degree of understanding via:  Teach Back   Monitoring/Evaluation:  Dietary intake, exercise, and body weight prn.

## 2016-05-19 NOTE — Patient Instructions (Addendum)
Plan: Saturated fat: look for American Heart Association heart check options. Consider using low-fat or fat free dairy and [place] yogurt. Consider including more berries, walnuts and almonds in your diet Consider dishing up 20% less than what you think you want at dinner Consider taking 20 min or more for dinner and eat slow, chew food well to help with GERD. Consider doing chair exercises 15 min a day as tolerated

## 2016-05-20 DIAGNOSIS — G4733 Obstructive sleep apnea (adult) (pediatric): Secondary | ICD-10-CM | POA: Diagnosis not present

## 2016-05-26 ENCOUNTER — Ambulatory Visit (AMBULATORY_SURGERY_CENTER): Payer: Self-pay

## 2016-05-26 VITALS — Ht 68.0 in | Wt 278.2 lb

## 2016-05-26 DIAGNOSIS — G4733 Obstructive sleep apnea (adult) (pediatric): Secondary | ICD-10-CM | POA: Diagnosis not present

## 2016-05-26 DIAGNOSIS — Z1211 Encounter for screening for malignant neoplasm of colon: Secondary | ICD-10-CM

## 2016-05-26 MED ORDER — SUPREP BOWEL PREP KIT 17.5-3.13-1.6 GM/177ML PO SOLN: 1.0000 | Freq: Once | ORAL | 0 refills | Status: AC

## 2016-05-26 NOTE — Progress Notes (Signed)
No allergies to eggs or soy No home oxygen No diet meds No past problems with anesthesia  Declined emmi  

## 2016-05-27 ENCOUNTER — Other Ambulatory Visit: Payer: Self-pay | Admitting: *Deleted

## 2016-05-27 DIAGNOSIS — G4733 Obstructive sleep apnea (adult) (pediatric): Secondary | ICD-10-CM

## 2016-06-01 ENCOUNTER — Encounter: Payer: Self-pay | Admitting: Gastroenterology

## 2016-06-02 ENCOUNTER — Telehealth: Payer: Self-pay | Admitting: Gastroenterology

## 2016-06-02 NOTE — Telephone Encounter (Signed)
Suprep bowel prep was called into [facility] in [place] per wife's request.  Spoke with pt to inform him of the prep. He will call back if he has further questions

## 2016-06-03 ENCOUNTER — Telehealth: Payer: Self-pay | Admitting: Gastroenterology

## 2016-06-03 NOTE — Telephone Encounter (Signed)
Wife insisted to give pt a 100% approved prep . "Gabalyte?"  Never heard of it.  Left Suprep sample at 4th floor desk for pt. Ihan Pat/PV

## 2016-06-03 NOTE — Telephone Encounter (Signed)
LMOM with pt's cell phone.  Explained he DOES NOT need prior authorization.  This situation comes up often and pharmacy needs to run it as "cash only".  Urged pt to call us back if pharmacy unable to resolve and a Miralax prep would be explained.  Then I called pharmacy and left similar message.  So far no return calls received.                                                       Angela/PV

## 2016-06-09 ENCOUNTER — Ambulatory Visit (AMBULATORY_SURGERY_CENTER): Payer: BLUE CROSS/BLUE SHIELD | Admitting: Gastroenterology

## 2016-06-09 ENCOUNTER — Encounter: Payer: Self-pay | Admitting: Gastroenterology

## 2016-06-09 VITALS — BP 132/89 | HR 68 | Temp 98.4°F | Resp 16 | Ht 68.0 in | Wt 278.0 lb

## 2016-06-09 DIAGNOSIS — D123 Benign neoplasm of transverse colon: Secondary | ICD-10-CM | POA: Diagnosis not present

## 2016-06-09 DIAGNOSIS — Z1212 Encounter for screening for malignant neoplasm of rectum: Secondary | ICD-10-CM | POA: Diagnosis not present

## 2016-06-09 DIAGNOSIS — D125 Benign neoplasm of sigmoid colon: Secondary | ICD-10-CM | POA: Diagnosis not present

## 2016-06-09 DIAGNOSIS — K635 Polyp of colon: Secondary | ICD-10-CM

## 2016-06-09 DIAGNOSIS — Z1211 Encounter for screening for malignant neoplasm of colon: Secondary | ICD-10-CM | POA: Diagnosis present

## 2016-06-09 HISTORY — PX: COLONOSCOPY: SHX174

## 2016-06-09 MED ORDER — SODIUM CHLORIDE 0.9 % IV SOLN: 500.0000 mL | INTRAVENOUS | Status: AC

## 2016-06-09 NOTE — Patient Instructions (Signed)
YOU HAD AN ENDOSCOPIC PROCEDURE TODAY AT THE [facility]:   Refer to the procedure report that was given to you for any specific questions about what was found during the examination.  If the procedure report does not answer your questions, please call your gastroenterologist to clarify.  If you requested that your care partner not be given the details of your procedure findings, then the procedure report has been included in a sealed envelope for you to review at your convenience later.  YOU SHOULD EXPECT: Some feelings of bloating in the abdomen. Passage of more gas than usual.  Walking can help get rid of the air that was put into your GI tract during the procedure and reduce the bloating. If you had a lower endoscopy (such as a colonoscopy or flexible sigmoidoscopy) you may notice spotting of blood in your stool or on the toilet paper. If you underwent a bowel prep for your procedure, you may not have a normal bowel movement for a few days.  Please Note:  You might notice some irritation and congestion in your nose or some drainage.  This is from the oxygen used during your procedure.  There is no need for concern and it should clear up in a day or so.  SYMPTOMS TO REPORT IMMEDIATELY:   Following lower endoscopy (colonoscopy or flexible sigmoidoscopy):  Excessive amounts of blood in the stool  Significant tenderness or worsening of abdominal pains  Swelling of the abdomen that is new, acute  Fever of 100F or higher   Following upper endoscopy (EGD)  Vomiting of blood or coffee ground material  New chest pain or pain under the shoulder blades  Painful or persistently difficult swallowing  New shortness of breath  Fever of 100F or higher  Black, tarry-looking stools  For urgent or emergent issues, a gastroenterologist can be reached at any hour by calling [phone].   DIET:  We do recommend a small meal at first, but then you may proceed to your regular diet.  Drink  plenty of fluids but you should avoid alcoholic beverages for 24 hours.  ACTIVITY:  You should plan to take it easy for the rest of today and you should NOT DRIVE or use heavy machinery until tomorrow (because of the sedation medicines used during the test).    FOLLOW UP: Our staff will call the number listed on your records the next business day following your procedure to check on you and address any questions or concerns that you may have regarding the information given to you following your procedure. If we do not reach you, we will leave a message.  However, if you are feeling well and you are not experiencing any problems, there is no need to return our call.  We will assume that you have returned to your regular daily activities without incident.  If any biopsies were taken you will be contacted by phone or by letter within the next 1-3 weeks.  Please call us at [phone] if you have not heard about the biopsies in 3 weeks.    SIGNATURES/CONFIDENTIALITY: You and/or your care partner have signed paperwork which will be entered into your electronic medical record.  These signatures attest to the fact that that the information above on your After Visit Summary has been reviewed and is understood.  Full responsibility of the confidentiality of this discharge information lies with you and/or your care-partner.  Polyp and hemorrhoid information given. 

## 2016-06-09 NOTE — Progress Notes (Signed)
Report to PACU, RN, vss, BBS= Clear.  

## 2016-06-09 NOTE — Op Note (Signed)
[facility] Patient Name: Benjamin Cunningham Procedure Date: 06/09/2016 10:20 AM MRN: [id number] Endoscopist: Mauri Pole , MD Age: 51 Referring MD:  Date of Birth: 1965-09-23 Gender: Male Account #: [id number] Procedure:                Colonoscopy Indications:              Screening for colorectal malignant neoplasm, This                            is the patient's first colonoscopy Medicines:                Monitored Anesthesia Care Procedure:                Pre-Anesthesia Assessment:                           - Prior to the procedure, a History and Physical                            was performed, and patient medications and                            allergies were reviewed. The patient's tolerance of                            previous anesthesia was also reviewed. The risks                            and benefits of the procedure and the sedation                            options and risks were discussed with the patient.                            All questions were answered, and informed consent                            was obtained. Prior Anticoagulants: The patient has                            taken no previous anticoagulant or antiplatelet                            agents. ASA Grade Assessment: II - A patient with                            mild systemic disease. After reviewing the risks                            and benefits, the patient was deemed in                            satisfactory condition to undergo the procedure.  After obtaining informed consent, the colonoscope                            was passed under direct vision. Throughout the                            procedure, the patient's blood pressure, pulse, and                            oxygen saturations were monitored continuously. The                            Colonoscope was introduced through the anus and                            advanced to the the  terminal ileum, with                            identification of the appendiceal orifice and IC                            valve. The colonoscopy was performed without                            difficulty. The patient tolerated the procedure                            well. The quality of the bowel preparation was                            excellent. The terminal ileum, ileocecal valve,                            appendiceal orifice, and rectum were photographed. Scope In: 10:32:46 AM Scope Out: 10:47:19 AM Scope Withdrawal Time: 0 hours 12 minutes 7 seconds  Total Procedure Duration: 0 hours 14 minutes 33 seconds  Findings:                 The perianal and digital rectal examinations were                            normal.                           A 4 mm polyp was found in the transverse colon. The                            polyp was flat. The polyp was removed with a cold                            biopsy forceps. Resection and retrieval were                            complete.  A 9 mm polyp was found in the sigmoid colon. The                            polyp was pedunculated. The polyp was removed with                            a hot snare. Resection and retrieval were complete.                           Non-bleeding internal hemorrhoids were found during                            retroflexion. The hemorrhoids were medium-sized. Complications:            No immediate complications. Estimated Blood Loss:     Estimated blood loss was minimal. Impression:               - One 4 mm polyp in the transverse colon, removed                            with a cold biopsy forceps. Resected and retrieved.                           - One 9 mm polyp in the sigmoid colon, removed with                            a hot snare. Resected and retrieved.                           - Non-bleeding internal hemorrhoids. Recommendation:           - Patient has a contact number  available for                            emergencies. The signs and symptoms of potential                            delayed complications were discussed with the                            patient. Return to normal activities tomorrow.                            Written discharge instructions were provided to the                            patient.                           - Resume previous diet.                           - Continue present medications.                           -  Await pathology results.                           - Repeat colonoscopy in 5-10 years for surveillance                            based on pathology results. Mauri Pole, MD 06/09/2016 10:54:49 AM This report has been signed electronically.

## 2016-06-09 NOTE — Progress Notes (Signed)
Called to room to assist during endoscopic procedure.  Patient ID and intended procedure confirmed with present staff. Received instructions for my participation in the procedure from the performing physician.  

## 2016-06-09 NOTE — Progress Notes (Signed)
Pt's states no medical or surgical changes since previsit or office visit. 

## 2016-06-10 ENCOUNTER — Telehealth: Payer: Self-pay | Admitting: *Deleted

## 2016-06-10 NOTE — Telephone Encounter (Signed)
  Follow up Call-  Call back number 06/09/2016  Post procedure Call Back phone  # [phone]  Permission to leave phone message Yes  Some recent data might be hidden     Patient questions:  Do you have a fever, pain , or abdominal swelling? No. Pain Score  0 *  Have you tolerated food without any problems? Yes.    Have you been able to return to your normal activities? Yes.    Do you have any questions about your discharge instructions: Diet   No. Medications  No. Follow up visit  No.  Do you have questions or concerns about your Care? No.  Actions: * If pain score is 4 or above: No action needed, pain <4. Migraine last evening, cleared today.

## 2016-06-12 ENCOUNTER — Encounter: Payer: Self-pay | Admitting: Gastroenterology

## 2016-08-06 ENCOUNTER — Emergency Department (HOSPITAL_COMMUNITY): Payer: Worker's Compensation

## 2016-08-06 ENCOUNTER — Encounter (HOSPITAL_COMMUNITY): Payer: Self-pay | Admitting: Emergency Medicine

## 2016-08-06 ENCOUNTER — Emergency Department (HOSPITAL_COMMUNITY)
Admission: EM | Admit: 2016-08-06 | Discharge: 2016-08-06 | Disposition: A | Discharge status: Home / Self Care | Payer: Worker's Compensation | Attending: Emergency Medicine | Admitting: Emergency Medicine

## 2016-08-06 DIAGNOSIS — Y939 Activity, unspecified: Secondary | ICD-10-CM | POA: Insufficient documentation

## 2016-08-06 DIAGNOSIS — M25571 Pain in right ankle and joints of right foot: Secondary | ICD-10-CM | POA: Diagnosis not present

## 2016-08-06 DIAGNOSIS — T1490XA Injury, unspecified, initial encounter: Secondary | ICD-10-CM

## 2016-08-06 DIAGNOSIS — S199XXA Unspecified injury of neck, initial encounter: Secondary | ICD-10-CM | POA: Diagnosis not present

## 2016-08-06 DIAGNOSIS — M79604 Pain in right leg: Secondary | ICD-10-CM | POA: Diagnosis not present

## 2016-08-06 DIAGNOSIS — S0101XA Laceration without foreign body of scalp, initial encounter: Secondary | ICD-10-CM | POA: Insufficient documentation

## 2016-08-06 DIAGNOSIS — R918 Other nonspecific abnormal finding of lung field: Secondary | ICD-10-CM | POA: Diagnosis not present

## 2016-08-06 DIAGNOSIS — R935 Abnormal findings on diagnostic imaging of other abdominal regions, including retroperitoneum: Secondary | ICD-10-CM | POA: Insufficient documentation

## 2016-08-06 DIAGNOSIS — S82831A Other fracture of upper and lower end of right fibula, initial encounter for closed fracture: Secondary | ICD-10-CM | POA: Diagnosis not present

## 2016-08-06 DIAGNOSIS — S8991XA Unspecified injury of right lower leg, initial encounter: Secondary | ICD-10-CM | POA: Diagnosis not present

## 2016-08-06 DIAGNOSIS — R063 Periodic breathing: Secondary | ICD-10-CM | POA: Diagnosis not present

## 2016-08-06 DIAGNOSIS — S0990XA Unspecified injury of head, initial encounter: Secondary | ICD-10-CM | POA: Insufficient documentation

## 2016-08-06 DIAGNOSIS — S82491A Other fracture of shaft of right fibula, initial encounter for closed fracture: Secondary | ICD-10-CM | POA: Diagnosis not present

## 2016-08-06 DIAGNOSIS — W1789XA Other fall from one level to another, initial encounter: Secondary | ICD-10-CM | POA: Diagnosis not present

## 2016-08-06 DIAGNOSIS — M25512 Pain in left shoulder: Secondary | ICD-10-CM | POA: Insufficient documentation

## 2016-08-06 DIAGNOSIS — M79671 Pain in right foot: Secondary | ICD-10-CM | POA: Diagnosis not present

## 2016-08-06 DIAGNOSIS — Y9289 Other specified places as the place of occurrence of the external cause: Secondary | ICD-10-CM | POA: Diagnosis not present

## 2016-08-06 DIAGNOSIS — M25561 Pain in right knee: Secondary | ICD-10-CM | POA: Diagnosis not present

## 2016-08-06 DIAGNOSIS — T148XXA Other injury of unspecified body region, initial encounter: Secondary | ICD-10-CM | POA: Diagnosis not present

## 2016-08-06 DIAGNOSIS — W19XXXA Unspecified fall, initial encounter: Secondary | ICD-10-CM

## 2016-08-06 DIAGNOSIS — S89291A Other physeal fracture of upper end of right fibula, initial encounter for closed fracture: Secondary | ICD-10-CM | POA: Diagnosis not present

## 2016-08-06 DIAGNOSIS — M79651 Pain in right thigh: Secondary | ICD-10-CM | POA: Diagnosis not present

## 2016-08-06 DIAGNOSIS — S3992XA Unspecified injury of lower back, initial encounter: Secondary | ICD-10-CM | POA: Diagnosis not present

## 2016-08-06 DIAGNOSIS — S3991XA Unspecified injury of abdomen, initial encounter: Secondary | ICD-10-CM | POA: Diagnosis not present

## 2016-08-06 DIAGNOSIS — S99921A Unspecified injury of right foot, initial encounter: Secondary | ICD-10-CM | POA: Diagnosis not present

## 2016-08-06 DIAGNOSIS — Y999 Unspecified external cause status: Secondary | ICD-10-CM | POA: Insufficient documentation

## 2016-08-06 DIAGNOSIS — M549 Dorsalgia, unspecified: Secondary | ICD-10-CM | POA: Diagnosis not present

## 2016-08-06 DIAGNOSIS — S99911A Unspecified injury of right ankle, initial encounter: Secondary | ICD-10-CM | POA: Diagnosis not present

## 2016-08-06 DIAGNOSIS — S299XXA Unspecified injury of thorax, initial encounter: Secondary | ICD-10-CM | POA: Diagnosis not present

## 2016-08-06 DIAGNOSIS — S3993XA Unspecified injury of pelvis, initial encounter: Secondary | ICD-10-CM | POA: Diagnosis not present

## 2016-08-06 DIAGNOSIS — M545 Low back pain: Secondary | ICD-10-CM | POA: Diagnosis not present

## 2016-08-06 LAB — I-STAT CHEM 8, ED
BUN: 22 mg/dL — ABNORMAL HIGH (ref 6–20)
CALCIUM ION: 1.09 mmol/L — AB (ref 1.15–1.40)
CHLORIDE: 108 mmol/L (ref 101–111)
Creatinine, Ser: 1 mg/dL (ref 0.61–1.24)
Glucose, Bld: 107 mg/dL — ABNORMAL HIGH (ref 65–99)
HCT: 45 % (ref 39.0–52.0)
Hemoglobin: 15.3 g/dL (ref 13.0–17.0)
POTASSIUM: 4.1 mmol/L (ref 3.5–5.1)
SODIUM: 140 mmol/L (ref 135–145)
TCO2: 23 mmol/L (ref 0–100)

## 2016-08-06 LAB — CBC
HEMATOCRIT: 45.6 % (ref 39.0–52.0)
HEMOGLOBIN: 15.6 g/dL (ref 13.0–17.0)
MCH: 29.6 pg (ref 26.0–34.0)
MCHC: 34.2 g/dL (ref 30.0–36.0)
MCV: 86.5 fL (ref 78.0–100.0)
Platelets: 252 10*3/uL (ref 150–400)
RBC: 5.27 MIL/uL (ref 4.22–5.81)
RDW: 13 % (ref 11.5–15.5)
WBC: 14.9 10*3/uL — ABNORMAL HIGH (ref 4.0–10.5)

## 2016-08-06 LAB — COMPREHENSIVE METABOLIC PANEL
ALBUMIN: 4.5 g/dL (ref 3.5–5.0)
ALK PHOS: 73 U/L (ref 38–126)
ALT: 62 U/L (ref 17–63)
ANION GAP: 11 (ref 5–15)
AST: 48 U/L — ABNORMAL HIGH (ref 15–41)
BUN: 19 mg/dL (ref 6–20)
CALCIUM: 9.5 mg/dL (ref 8.9–10.3)
CHLORIDE: 108 mmol/L (ref 101–111)
CO2: 21 mmol/L — AB (ref 22–32)
Creatinine, Ser: 1.09 mg/dL (ref 0.61–1.24)
GFR calc Af Amer: >60 mL/min (ref >60)
GFR calc non Af Amer: >60 mL/min (ref >60)
GLUCOSE: 111 mg/dL — AB (ref 65–99)
Potassium: 4.2 mmol/L (ref 3.5–5.1)
Sodium: 140 mmol/L (ref 135–145)
Total Bilirubin: 0.7 mg/dL (ref 0.3–1.2)
Total Protein: 7.5 g/dL (ref 6.5–8.1)

## 2016-08-06 LAB — PROTIME-INR
INR: 0.92
Prothrombin Time: 12.3 seconds (ref 11.4–15.2)

## 2016-08-06 LAB — ETHANOL: Alcohol, Ethyl (B): <5 mg/dL (ref <5)

## 2016-08-06 LAB — I-STAT CG4 LACTIC ACID, ED: Lactic Acid, Venous: 1.2 mmol/L (ref 0.5–1.9)

## 2016-08-06 MED ORDER — IOPAMIDOL (ISOVUE-300) INJECTION 61%
INTRAVENOUS | Status: AC
  Filled 2016-08-06: qty 100

## 2016-08-06 MED ORDER — OXYCODONE HCL 5 MG PO TABS
5.0000 mg | ORAL_TABLET | Freq: Once | ORAL | Status: AC
  Administered 2016-08-06: 5 mg via ORAL
  Filled 2016-08-06: qty 1

## 2016-08-06 MED ORDER — HYDROMORPHONE HCL 1 MG/ML IJ SOLN
1.0000 mg | Freq: Once | INTRAMUSCULAR | Status: AC
Start: 2016-08-06 — End: 2016-08-06
  Administered 2016-08-06: 1 mg via INTRAVENOUS
  Filled 2016-08-06: qty 1

## 2016-08-06 MED ORDER — LIDOCAINE HCL (PF) 1 % IJ SOLN
5.0000 mL | Freq: Once | INTRAMUSCULAR | Status: DC
  Filled 2016-08-06: qty 5

## 2016-08-06 MED ORDER — HYDROCODONE-ACETAMINOPHEN 5-325 MG PO TABS: 1.0000 | ORAL_TABLET | ORAL | 0 refills | Status: DC | PRN

## 2016-08-06 MED ORDER — TETANUS-DIPHTH-ACELL PERTUSSIS 5-2.5-18.5 LF-MCG/0.5 IM SUSP
0.5000 mL | Freq: Once | INTRAMUSCULAR | Status: AC
  Administered 2016-08-06: 0.5 mL via INTRAMUSCULAR
  Filled 2016-08-06: qty 0.5

## 2016-08-06 MED ORDER — HYDROMORPHONE HCL 1 MG/ML IJ SOLN
INTRAMUSCULAR | Status: AC
  Administered 2016-08-06: 1 mg
  Filled 2016-08-06: qty 1

## 2016-08-06 MED ORDER — SODIUM CHLORIDE 0.9 % IV BOLUS (SEPSIS)
1000.0000 mL | Freq: Once | INTRAVENOUS | Status: AC
  Administered 2016-08-06: 1000 mL via INTRAVENOUS

## 2016-08-06 NOTE — ED Notes (Signed)
Resident MD to remove pt's C-Collar. Once collar is removed, Tech will clean pt's head wound.

## 2016-08-06 NOTE — ED Notes (Signed)
Patient transported to X-ray 

## 2016-08-06 NOTE — ED Provider Notes (Signed)
I have personally seen and examined the patient. I have reviewed the documentation on PMH/FH/Soc Hx. I have discussed the plan of care with the resident and patient.  I have reviewed and agree with the resident's documentation. Please see associated encounter note.     Fatima Blank, MD 08/06/16 [phone]

## 2016-08-06 NOTE — ED Provider Notes (Signed)
[facility] DEPT Provider Note   CSN: [id number] Arrival date & time: 08/06/16  1518     History   Chief Complaint Chief Complaint  Patient presents with  . Fall  . Leg Pain    HPI Benjamin Cunningham is a 51 y.o. male.  The history is provided by the patient.  Trauma Mechanism of injury: fall Injury location: head/neck, leg and torso Injury location detail: L chest and R lower leg, R knee, R ankle and R upper leg Incident location: outdoors Time since incident: 4 hours Arrived directly from scene: no   Fall:      Height of fall: 10+ feet off off a railing and landed on hard surface      Point of impact: unknown      Suspicion of alcohol use: no  EMS/PTA data:      Bystander interventions: none      Ambulatory at scene: yes      Blood loss: none      Responsiveness: alert      Oriented to: person, situation, place and time      Loss of consciousness: no      Amnesic to event: yes (partially)      Breathing interventions: none      IV access: none      Fluids administered: none      Medications administered: none      Immobilization: C-collar      Airway condition since incident: stable      Breathing condition since incident: stable      Circulation condition since incident: stable      Mental status condition since incident: stable      Disability condition since incident: stable  Current symptoms:      Pain scale: 6/10      Pain quality: aching and dull      Pain timing: constant      Associated symptoms:            Denies abdominal pain, back pain, blindness, chest pain, difficulty breathing, headache, hearing loss, loss of consciousness, nausea, neck pain, seizures and vomiting.   Relevant PMH:      Tetanus status: unknown   Past Medical History:  Diagnosis Date  . GERD (gastroesophageal reflux disease)   . History of seasonal allergies     Patient Active Problem List   Diagnosis Date Noted  . Obstructive sleep apnea 04/30/2016  . SORE THROAT  04/23/2009  . VIRAL URI 04/23/2009  . DEPRESSION 05/10/2007  . PNEUMONIA 05/10/2007    Past Surgical History:  Procedure Laterality Date  . WISDOM TOOTH EXTRACTION     around early 62s       Home Medications    Prior to Admission medications   Medication Sig Start Date End Date Taking? Authorizing Provider  cetirizine (ZYRTEC) 10 MG tablet Take 10 mg by mouth daily.   Yes [provider]  ibuprofen (ADVIL,MOTRIN) 200 MG tablet Take 1,000 mg by mouth every 12 (twelve) hours as needed (for pain).   Yes [provider]  Multiple Vitamins-Minerals (CENTRUM SILVER 50+MEN) TABS Take 1 tablet by mouth daily.   Yes [provider]  omeprazole (PRILOSEC) 40 MG capsule Take 1 capsule (40 mg total) by mouth daily. 04/14/16  Yes Laurey Morale, MD  HYDROcodone-acetaminophen (NORCO/VICODIN) 5-325 MG tablet Take 1 tablet by mouth every 4 (four) hours as needed. 08/06/16   Lennice Sites, DO    Family History Family History  Problem  Relation Age of Onset  . Cervical cancer Mother   . Breast cancer Maternal Grandmother   . Liver cancer Maternal Grandmother   . Colon cancer Neg Hx     Social History Social History  Substance Use Topics  . Smoking status: Never Smoker  . Smokeless tobacco: Never Used  . Alcohol use Yes     Comment: 1-2 drinks per month     Allergies   Patient has no known allergies.   Review of Systems Review of Systems  Constitutional: Negative for chills and fever.  HENT: Negative for ear pain, hearing loss and sore throat.   Eyes: Negative for blindness, pain and visual disturbance.  Respiratory: Negative for cough and shortness of breath.   Cardiovascular: Negative for chest pain and palpitations.  Gastrointestinal: Negative for abdominal pain, nausea and vomiting.  Genitourinary: Negative for dysuria and hematuria.  Musculoskeletal: Positive for gait problem. Negative for arthralgias, back pain and neck pain.  Skin: Positive for  wound. Negative for color change and rash.  Neurological: Negative for seizures, loss of consciousness, syncope and headaches.  All other systems reviewed and are negative.    Physical Exam Updated Vital Signs  ED Triage Vitals  Enc Vitals Group     BP 08/06/16 1527 (!) 149/90     Pulse Rate 08/06/16 1527 92     Resp 08/06/16 1527 (!) 22     Temp 08/06/16 1527 98.2 F (36.8 C)     Temp Source 08/06/16 1527 Oral     SpO2 08/06/16 1524 93 %     Weight 08/06/16 1527 270 lb (122.5 kg)     Height 08/06/16 1527 5\' 8"  (1.727 m)     Head Circumference --      Peak Flow --      Pain Score 08/06/16 1524 8     Pain Loc --      Pain Edu? --      Excl. in [place]? --     Physical Exam  Constitutional: He is oriented to person, place, and time. He appears well-developed and well-nourished.  HENT:  Head: Normocephalic.  Nose: Nose normal.  Mouth/Throat: Oropharynx is clear and moist.  Laceration to head, hemostatic  Eyes: Conjunctivae and EOM are normal. Pupils are equal, round, and reactive to light.  Neck: Neck supple.  In towel c-collar  Cardiovascular: Normal rate and regular rhythm.   No murmur heard. Pulmonary/Chest: Effort normal and breath sounds normal. No respiratory distress. He exhibits tenderness (TTP over left clavicle).  Abdominal: Soft. Bowel sounds are normal. He exhibits no distension. There is no tenderness.  Musculoskeletal: He exhibits tenderness (TTP to right thigh, knee, tib/fib, ankle). He exhibits no edema.  No midline spinal tenderness  Neurological: He is alert and oriented to person, place, and time.  Skin: Skin is warm and dry. Capillary refill takes less than 2 seconds.  Psychiatric: He has a normal mood and affect.  Nursing note and vitals reviewed.    ED Treatments / Results  Labs (all labs ordered are listed, but only abnormal results are displayed) Labs Reviewed  COMPREHENSIVE METABOLIC PANEL - Abnormal; Notable for the following:       Result  Value   CO2 21 (*)    Glucose, Bld 111 (*)    AST 48 (*)    All other components within normal limits  CBC - Abnormal; Notable for the following:    WBC 14.9 (*)    All other components within normal limits  I-STAT CHEM 8, ED - Abnormal; Notable for the following:    BUN 22 (*)    Glucose, Bld 107 (*)    Calcium, Ion 1.09 (*)    All other components within normal limits  ETHANOL  PROTIME-INR  URINALYSIS, ROUTINE W REFLEX MICROSCOPIC  I-STAT CG4 LACTIC ACID, ED    EKG  EKG Interpretation None       Radiology Dg Tibia/fibula Right  Result Date: 08/06/2016 CLINICAL DATA:  Pain after trauma EXAM: RIGHT TIBIA AND FIBULA - 2 VIEW COMPARISON:  None. FINDINGS: There is a nondisplaced fracture through the proximal fibular diaphysis. No other acute fractures identified. IMPRESSION: Nondisplaced fracture through the proximal fibular diaphysis. Electronically Signed   By: Dorise Bullion III M.D   On: 08/06/2016 17:19   Dg Ankle Complete Right  Result Date: 08/06/2016 CLINICAL DATA:  Status post fall 8 feet, with proximal right foot and right lower leg pain. Initial encounter. EXAM: RIGHT ANKLE - COMPLETE 3+ VIEW COMPARISON:  Right ankle radiographs performed 01/20/2013 FINDINGS: There is no evidence of fracture or dislocation. The ankle mortise is intact; the interosseous space is within normal limits. No talar tilt or subluxation is seen. The joint spaces are preserved. No significant soft tissue abnormalities are seen. IMPRESSION: No evidence of fracture or dislocation. Electronically Signed   By: Garald Balding M.D.   On: 08/06/2016 18:49   Ct Head Wo Contrast  Result Date: 08/06/2016 CLINICAL DATA:  [address] 2 stories.  Injured head and neck. EXAM: CT HEAD WITHOUT CONTRAST CT CERVICAL SPINE WITHOUT CONTRAST TECHNIQUE: Multidetector CT imaging of the head and cervical spine was performed following the standard protocol without intravenous contrast. Multiplanar CT image reconstructions of the  cervical spine were also generated. COMPARISON:  None. FINDINGS: CT HEAD FINDINGS Brain: The ventricles are normal in size and configuration. No extra-axial fluid collections are identified. The gray-white differentiation is normal. No CT findings for acute intracranial process such as hemorrhage or infarction. No mass lesions. The brainstem and cerebellum are grossly normal. Vascular: No hyperdense vessel or unexpected calcification. Skull: Normal. Negative for fracture or focal lesion. Sinuses/Orbits: Scattered paranasal sinus disease. The mastoid air cells and middle ear cavities are clear. Other: No scalp hematoma. CT CERVICAL SPINE FINDINGS Alignment: Normal overall alignment. Skull base and vertebrae: No skullbase fractures are identified. The C1-2 articulations are maintained. The skullbase C1 articulations are maintained. Probable congenital abnormality involving C2 with unfused dens ossification center. Alternatively this could be an old ununited fracture. No acute cervical spine fracture. Moderate multilevel facet disease for age. No facet or laminar fractures. Soft tissues and spinal canal: No significant soft tissue abnormalities. Disc levels: No obvious disc protrusions, spinal or foraminal stenosis. Upper chest: The lung apices are grossly clear. Other: Normal appearing thyroid gland. No neck masses or adenopathy. IMPRESSION: 1. No acute intracranial findings or skull fracture. 2. Scattered paranasal sinus disease. 3. 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 4. No acute cervical spine fracture. Electronically Signed   By: Marijo Sanes M.D.   On: 08/06/2016 17:07   Ct Chest W Contrast  Result Date: 08/06/2016 CLINICAL DATA:  Fall.  Trauma. EXAM: CT CHEST, ABDOMEN, AND PELVIS WITH CONTRAST TECHNIQUE: Multidetector CT imaging of the chest, abdomen and pelvis was performed following the standard protocol during bolus administration of intravenous contrast. CONTRAST:  100 cc  Omnipaque 300. COMPARISON:  Chest radiograph from earlier today. FINDINGS: CT CHEST FINDINGS Cardiovascular: Normal heart size. No significant pericardial fluid/thickening. Mildly atherosclerotic nonaneurysmal  thoracic aorta. Normal caliber pulmonary arteries. No evidence of acute thoracic aortic injury. No central pulmonary emboli. Mediastinum/Nodes: No pneumomediastinum. No mediastinal hematoma. No discrete thyroid nodules. Unremarkable esophagus. No axillary, mediastinal or hilar lymphadenopathy. Lungs/Pleura: No pneumothorax. No pleural effusion. Subpleural 6 x 4 mm (average diameter 5 mm) superior right middle lobe solid pulmonary nodule associated with the minor fissure (series 4/ image 81). No acute consolidative airspace disease, lung masses or additional significant pulmonary nodules. No pneumatoceles. Minimal hypoventilatory changes in the dependent lungs. Musculoskeletal: No aggressive appearing focal osseous lesions. No acute fracture in the chest. Incompletely healed lateral left eighth, ninth and tenth rib fractures, which appear subacute or chronic. Mild thoracic spondylosis. CT ABDOMEN PELVIS FINDINGS Hepatobiliary: Normal liver size. Diffuse hepatic steatosis. No definite liver surface irregularity. No liver mass. No liver laceration. Normal gallbladder with no radiopaque cholelithiasis. No biliary ductal dilatation. Pancreas: Normal, with no laceration, mass or duct dilation. Spleen: Normal size. No laceration or mass. Adrenals/Urinary Tract: Normal adrenals. No hydronephrosis. No renal laceration. Subcentimeter hypodense renal cortical lesion in the anterior interpolar left kidney, too small to characterize, for which no further follow-up is required. No additional renal lesions. Normal bladder. Stomach/Bowel: Small hiatal hernia. Otherwise collapsed and grossly normal stomach. Normal caliber small bowel with no small bowel wall thickening. Normal appendix. Normal large bowel with no  diverticulosis, large bowel wall thickening or pericolonic fat stranding. Vascular/Lymphatic: Normal caliber abdominal aorta. No evidence of acute traumatic injury the abdominal aorta or branch vessels. Patent portal, splenic, hepatic and renal veins. No pathologically enlarged lymph nodes in the abdomen or pelvis. Reproductive: Top-normal size prostate. Other: No pneumoperitoneum, ascites or focal fluid collection. Musculoskeletal: No aggressive appearing focal osseous lesions. No fracture in the abdomen or pelvis. Minimal lumbar spondylosis. Prominent facet arthropathy bilaterally at L4-5. IMPRESSION: 1. No acute traumatic injury in the chest, abdomen or pelvis. 2. Incompletely healed lateral left eighth, ninth and tenth rib fractures, which appear subacute or chronic. 3. Subpleural 5 mm right middle lobe pulmonary nodule, probably benign. No follow-up needed if patient is low-risk. Non-contrast chest CT can be considered in 12 months if patient is high-risk. This recommendation follows the consensus statement: Guidelines for Management of Incidental Pulmonary Nodules Detected on CT Images: From the Fleischner Society 2017; Radiology 2017; 284:228-243. 4. Additional findings include aortic atherosclerosis, diffuse hepatic steatosis and small hiatal hernia. Electronically Signed   By: Ilona Sorrel M.D.   On: 08/06/2016 17:17   Ct Cervical Spine Wo Contrast  Result Date: 08/06/2016 CLINICAL DATA:  [address] 2 [address].  Injured head and neck. EXAM: CT HEAD WITHOUT CONTRAST CT CERVICAL SPINE WITHOUT CONTRAST TECHNIQUE: Multidetector CT imaging of the head and cervical spine was performed following the standard protocol without intravenous contrast. Multiplanar CT image reconstructions of the cervical spine were also generated. COMPARISON:  None. FINDINGS: CT HEAD FINDINGS Brain: The ventricles are normal in size and configuration. No extra-axial fluid collections are identified. The gray-white differentiation is  normal. No CT findings for acute intracranial process such as hemorrhage or infarction. No mass lesions. The brainstem and cerebellum are grossly normal. Vascular: No hyperdense vessel or unexpected calcification. Skull: Normal. Negative for fracture or focal lesion. Sinuses/Orbits: Scattered paranasal sinus disease. The mastoid air cells and middle ear cavities are clear. Other: No scalp hematoma. CT CERVICAL SPINE FINDINGS Alignment: Normal overall alignment. Skull base and vertebrae: No skullbase fractures are identified. The C1-2 articulations are maintained. The skullbase C1 articulations are maintained. Probable congenital abnormality involving C2 with  unfused dens ossification center. Alternatively this could be an old ununited fracture. No acute cervical spine fracture. Moderate multilevel facet disease for age. No facet or laminar fractures. Soft tissues and spinal canal: No significant soft tissue abnormalities. Disc levels: No obvious disc protrusions, spinal or foraminal stenosis. Upper chest: The lung apices are grossly clear. Other: Normal appearing thyroid gland. No neck masses or adenopathy. IMPRESSION: 1. No acute intracranial findings or skull fracture. 2. Scattered paranasal sinus disease. 3. 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 4. No acute cervical spine fracture. Electronically Signed   By: Marijo Sanes M.D.   On: 08/06/2016 17:07   Ct Abdomen Pelvis W Contrast  Result Date: 08/06/2016 CLINICAL DATA:  Fall.  Trauma. EXAM: CT CHEST, ABDOMEN, AND PELVIS WITH CONTRAST TECHNIQUE: Multidetector CT imaging of the chest, abdomen and pelvis was performed following the standard protocol during bolus administration of intravenous contrast. CONTRAST:  100 cc Omnipaque 300. COMPARISON:  Chest radiograph from earlier today. FINDINGS: CT CHEST FINDINGS Cardiovascular: Normal heart size. No significant pericardial fluid/thickening. Mildly atherosclerotic nonaneurysmal thoracic  aorta. Normal caliber pulmonary arteries. No evidence of acute thoracic aortic injury. No central pulmonary emboli. Mediastinum/Nodes: No pneumomediastinum. No mediastinal hematoma. No discrete thyroid nodules. Unremarkable esophagus. No axillary, mediastinal or hilar lymphadenopathy. Lungs/Pleura: No pneumothorax. No pleural effusion. Subpleural 6 x 4 mm (average diameter 5 mm) superior right middle lobe solid pulmonary nodule associated with the minor fissure (series 4/ image 81). No acute consolidative airspace disease, lung masses or additional significant pulmonary nodules. No pneumatoceles. Minimal hypoventilatory changes in the dependent lungs. Musculoskeletal: No aggressive appearing focal osseous lesions. No acute fracture in the chest. Incompletely healed lateral left eighth, ninth and tenth rib fractures, which appear subacute or chronic. Mild thoracic spondylosis. CT ABDOMEN PELVIS FINDINGS Hepatobiliary: Normal liver size. Diffuse hepatic steatosis. No definite liver surface irregularity. No liver mass. No liver laceration. Normal gallbladder with no radiopaque cholelithiasis. No biliary ductal dilatation. Pancreas: Normal, with no laceration, mass or duct dilation. Spleen: Normal size. No laceration or mass. Adrenals/Urinary Tract: Normal adrenals. No hydronephrosis. No renal laceration. Subcentimeter hypodense renal cortical lesion in the anterior interpolar left kidney, too small to characterize, for which no further follow-up is required. No additional renal lesions. Normal bladder. Stomach/Bowel: Small hiatal hernia. Otherwise collapsed and grossly normal stomach. Normal caliber small bowel with no small bowel wall thickening. Normal appendix. Normal large bowel with no diverticulosis, large bowel wall thickening or pericolonic fat stranding. Vascular/Lymphatic: Normal caliber abdominal aorta. No evidence of acute traumatic injury the abdominal aorta or branch vessels. Patent portal, splenic,  hepatic and renal veins. No pathologically enlarged lymph nodes in the abdomen or pelvis. Reproductive: Top-normal size prostate. Other: No pneumoperitoneum, ascites or focal fluid collection. Musculoskeletal: No aggressive appearing focal osseous lesions. No fracture in the abdomen or pelvis. Minimal lumbar spondylosis. Prominent facet arthropathy bilaterally at L4-5. IMPRESSION: 1. No acute traumatic injury in the chest, abdomen or pelvis. 2. Incompletely healed lateral left eighth, ninth and tenth rib fractures, which appear subacute or chronic. 3. Subpleural 5 mm right middle lobe pulmonary nodule, probably benign. No follow-up needed if patient is low-risk. Non-contrast chest CT can be considered in 12 months if patient is high-risk. This recommendation follows the consensus statement: Guidelines for Management of Incidental Pulmonary Nodules Detected on CT Images: From the Fleischner Society 2017; Radiology 2017; 284:228-243. 4. Additional findings include aortic atherosclerosis, diffuse hepatic steatosis and small hiatal hernia. Electronically Signed   By: Janina Mayo.D.  On: 08/06/2016 17:17   Dg Pelvis Portable  Result Date: 08/06/2016 CLINICAL DATA:  Status post fall with right leg pain. EXAM: PORTABLE PELVIS 1-2 VIEWS COMPARISON:  None. FINDINGS: There is no evidence of pelvic fracture or diastasis. No pelvic bone lesions are seen. IMPRESSION: Negative. Electronically Signed   By: Fidela Salisbury M.D.   On: 08/06/2016 16:48   Ct T-spine No Charge  Result Date: 08/06/2016 CLINICAL DATA:  Fall, trauma, back pain. EXAM: CT THORACIC AND LUMBAR SPINE WITHOUT CONTRAST TECHNIQUE: Multidetector CT imaging of the thoracic and lumbar spine was performed without contrast. Multiplanar CT image reconstructions were also generated. COMPARISON:  No prior exams. Images reformatted from dedicated chest abdomen pelvis CT, reported separately. FINDINGS: CT THORACIC SPINE FINDINGS Alignment: Normal.  Vertebrae: No acute fracture or focal pathologic process. Vertebral body heights are preserved. Paraspinal and other soft tissues: Negative. Disc levels: Mild endplate spurring with preservation of disc spaces. CT LUMBAR SPINE FINDINGS Segmentation: 5 lumbar type vertebrae. Alignment: Normal. Vertebrae: No acute fracture or focal pathologic process. Sacrum is intact. Paraspinal and other soft tissues: Negative. Disc levels: Mild endplate spurring with preservation of disc spaces. There is facet hypertrophy at L4-L5 and to a lesser extent L5-S1. IMPRESSION: CT THORACIC SPINE IMPRESSION No fracture or subluxation. CT LUMBAR SPINE IMPRESSION No fracture or subluxation. Electronically Signed   By: Jeb Levering M.D.   On: 08/06/2016 18:35   Ct L-spine No Charge  Result Date: 08/06/2016 CLINICAL DATA:  Fall, trauma, back pain. EXAM: CT THORACIC AND LUMBAR SPINE WITHOUT CONTRAST TECHNIQUE: Multidetector CT imaging of the thoracic and lumbar spine was performed without contrast. Multiplanar CT image reconstructions were also generated. COMPARISON:  No prior exams. Images reformatted from dedicated chest abdomen pelvis CT, reported separately. FINDINGS: CT THORACIC SPINE FINDINGS Alignment: Normal. Vertebrae: No acute fracture or focal pathologic process. Vertebral body heights are preserved. Paraspinal and other soft tissues: Negative. Disc levels: Mild endplate spurring with preservation of disc spaces. CT LUMBAR SPINE FINDINGS Segmentation: 5 lumbar type vertebrae. Alignment: Normal. Vertebrae: No acute fracture or focal pathologic process. Sacrum is intact. Paraspinal and other soft tissues: Negative. Disc levels: Mild endplate spurring with preservation of disc spaces. There is facet hypertrophy at L4-L5 and to a lesser extent L5-S1. IMPRESSION: CT THORACIC SPINE IMPRESSION No fracture or subluxation. CT LUMBAR SPINE IMPRESSION No fracture or subluxation. Electronically Signed   By: Jeb Levering M.D.   On:  08/06/2016 18:35   Dg Chest Port 1 View  Result Date: 08/06/2016 CLINICAL DATA:  Fall.  Shallow breathing. EXAM: PORTABLE CHEST 1 VIEW COMPARISON:  None. FINDINGS: The heart size and mediastinal contours are within normal limits. Both lungs are clear. The visualized skeletal structures are unremarkable. IMPRESSION: No active disease. Electronically Signed   By: Staci Righter M.D.   On: 08/06/2016 16:51   Dg Foot Complete Right  Result Date: 08/06/2016 CLINICAL DATA:  Pain after fall. EXAM: RIGHT FOOT COMPLETE - 3+ VIEW COMPARISON:  None. FINDINGS: There is no evidence of fracture or dislocation. There is no evidence of arthropathy or other focal bone abnormality. Soft tissues are unremarkable. IMPRESSION: Negative. Electronically Signed   By: Dorise Bullion III M.D   On: 08/06/2016 17:17   Dg Femur, Min 2 Views Right  Result Date: 08/06/2016 CLINICAL DATA:  [address] from a second floor balcony leading on the first for. The right hip and leg hit a metal railing while falling. The patient denies hip pain hest pain bruising over the distal  thigh and proximal tibia. EXAM: RIGHT FEMUR 2 VIEWS COMPARISON:  None in PACs FINDINGS: The femur is subjectively adequately mineralized. There is no acute femur fracture. The observed portions of the right hip are normal. The right knee is unremarkable. There is a transversely oriented minimally distracted fracture through the shaft of the proximal fibula. The proximal tibia appears intact. There is chronic irregularity of the tibial tuberosity. IMPRESSION: Nondisplaced fracture through the proximal shaft of the right fibula. The adjacent tibia appears intact. The femur exhibits no acute fracture. Electronically Signed   By: David  Martinique M.D.   On: 08/06/2016 16:54    Procedures .Marland KitchenLaceration Repair Date/Time: 08/06/2016 7:18 PM Performed by: Ronnald Nian, Marcelina Mclaurin Authorized by: Fatima Blank   Consent:    Consent obtained:  Verbal   Consent given by:  Patient    Risks discussed:  Infection, nerve damage, pain, poor cosmetic result, need for additional repair, retained foreign body, poor wound healing, tendon damage and vascular damage   Alternatives discussed:  No treatment Anesthesia (see MAR for exact dosages):    Anesthesia method:  Local infiltration   Local anesthetic:  Lidocaine 1% w/o epi Laceration details:    Location:  Scalp   Scalp location:  R temporal   Length (cm):  3   Depth (mm):  1 Pre-procedure details:    Preparation:  Patient was prepped and draped in usual sterile fashion and imaging obtained to evaluate for foreign bodies Exploration:    Hemostasis achieved with:  Direct pressure   Wound exploration: entire depth of wound probed and visualized     Wound extent: no vascular damage noted     Contaminated: no   Treatment:    Area cleansed with:  Saline   Amount of cleaning:  Standard   Irrigation solution:  Sterile saline   Irrigation volume:  500 cc   Irrigation method:  Pressure wash   Visualized foreign bodies/material removed: no   Skin repair:    Repair method:  Staples   Number of staples:  4 Approximation:    Approximation:  Close Post-procedure details:    Dressing:  Non-adherent dressing   Patient tolerance of procedure:  Tolerated well, no immediate complications   (including critical care time)  Medications Ordered in ED Medications  iopamidol (ISOVUE-300) 61 % injection (not administered)  lidocaine (PF) (XYLOCAINE) 1 % injection 5 mL (not administered)  HYDROmorphone (DILAUDID) 1 MG/ML injection (1 mg  Given 08/06/16 1535)  sodium chloride 0.9 % bolus 1,000 mL (0 mLs Intravenous Stopped 08/06/16 1842)  Tdap (BOOSTRIX) injection 0.5 mL (0.5 mLs Intramuscular Given 08/06/16 1730)  HYDROmorphone (DILAUDID) injection 1 mg (1 mg Intravenous Given 08/06/16 1731)  oxyCODONE (Oxy IR/ROXICODONE) immediate release tablet 5 mg (5 mg Oral Given 08/06/16 1947)     Initial Impression / Assessment and Plan / ED  Course  I have reviewed the triage vital signs and the nursing notes.  Pertinent labs & imaging results that were available during my care of the patient were reviewed by me and considered in my medical decision making (see chart for details).     Benjamin Cunningham is a 51 year old male with history of reflux who presents to the ED as level II trauma for fall from 10 feet. Patient's vitals at the time of arrival to the ED are within normal limits. Patient with airway, breathing, circulation intact. Patient states that he fell backwards off of a ledge and landed 10 feet with his arms stretched out but  does not remember exactly how he fell. Patient was ambulatory at the scene and is actually coming from an urgent care. Patient states that he has pain mostly in the right lower extremity. Patient has laceration to the back side of his head and tenderness over the right tibia but otherwise exam is unremarkable. Lac repaired with staples and Tdap given. Given mechanism and signs of head trauma will get trauma labs and scans and extremity xrays. Patient with bedside chest x-ray and pelvic x-ray with no acute findings including pneumothorax or widened pubic symphysis. Patient given normal saline bolus and IV Dilaudid for pain.  Patient with unremarkable labs. Trauma imaging significant for right nondisplaced fibular fracture. Otherwise no acute trauma findings. Patient incidentally with pulmonary nodule and made aware of need for follow-up with primary care provider. Patient was placed in knee immobilizer and given information to follow-up with orthopedics. Told patient that he is to be nonweightbearing until follow-up with orthopedics. Patient given prescription for Norco for pain and also told to use Motrin. Warned of side effects Norco. Patient given a work note as well. Patient was discharged in good condition. Patient given return precautions.  Final Clinical Impressions(s) / ED Diagnoses   Final diagnoses:    Fall  Trauma  Closed fracture of proximal end of right fibula, unspecified fracture morphology, initial encounter    New Prescriptions Discharge Medication List as of 08/06/2016  7:22 PM       Lennice Sites, DO 08/06/16 2336

## 2016-08-06 NOTE — Discharge Instructions (Signed)
Follow-up with primary care provider in one week for his staple removal. Patient needs repeat CT chest in 1 year to evaluate pulmonary nodules. Follow-up with orthopedics for leg fracture and he should be nonweightbearing on the right leg during this time.

## 2016-08-06 NOTE — ED Triage Notes (Addendum)
Pt arrives from urgent care via EMS reporting fall from 2nd story, approx 8 ft, today at worksite.  Lac noted to posterior head, swelling to L shoulder, bruising to R leg.  Pt denies LOC, neuro intact.

## 2016-08-06 NOTE — ED Notes (Signed)
ED Provider at bedside. 

## 2016-08-06 NOTE — Progress Notes (Signed)
Orthopedic Tech Progress Note Patient Details:  Benjamin Cunningham 1965/08/06 [id number]  Ortho Devices Type of Ortho Device: Ankle Air splint Ortho Device/Splint Location: RLE Ortho Device/Splint Interventions: Ordered, Application   Braulio Bosch 08/06/2016, 9:18 PM

## 2016-08-06 NOTE — Progress Notes (Signed)
Orthopedic Tech Progress Note Patient Details:  Benjamin Cunningham 1966/03/17 [id number]  Ortho Devices Type of Ortho Device: Knee Immobilizer, Crutches Ortho Device/Splint Interventions: Application   [name] 08/06/2016, 7:14 PM

## 2016-08-07 NOTE — ED Notes (Signed)
pts boots were never located. Pt was originally in Trauma A before moving to room 16. both rooms was searched, the day shift primary nurse, 2 techs and EMS were all called. This RN was unable to locate the boots. Charge Nurse was made aware.

## 2016-08-07 NOTE — ED Notes (Signed)
Reviewed dc instructions with pt and family. Pt states daughter is at the store getting some clothes. Pt reports he had on one work boot and the other was in a plastic bag before he was moved to this room. Pt states boots were $200 and needs them back prior to DC.

## 2016-08-11 DIAGNOSIS — S82831A Other fracture of upper and lower end of right fibula, initial encounter for closed fracture: Secondary | ICD-10-CM | POA: Diagnosis not present

## 2016-08-12 ENCOUNTER — Ambulatory Visit (INDEPENDENT_AMBULATORY_CARE_PROVIDER_SITE_OTHER): Payer: BLUE CROSS/BLUE SHIELD | Admitting: Family Medicine

## 2016-08-12 ENCOUNTER — Encounter: Payer: Self-pay | Admitting: Family Medicine

## 2016-08-12 VITALS — BP 137/94 | HR 89 | Temp 98.2°F

## 2016-08-12 DIAGNOSIS — S40012D Contusion of left shoulder, subsequent encounter: Secondary | ICD-10-CM

## 2016-08-12 DIAGNOSIS — S82401D Unspecified fracture of shaft of right fibula, subsequent encounter for closed fracture with routine healing: Secondary | ICD-10-CM

## 2016-08-12 DIAGNOSIS — S0101XD Laceration without foreign body of scalp, subsequent encounter: Secondary | ICD-10-CM

## 2016-08-12 MED ORDER — HYDROCODONE-ACETAMINOPHEN 5-325 MG PO TABS: 1.0000 | ORAL_TABLET | ORAL | 0 refills | Status: DC | PRN

## 2016-08-12 NOTE — Progress Notes (Signed)
   Subjective:    Patient ID: Benjamin Cunningham, male    DOB: [birth year], 51 y.o.   MRN: [id number]  HPI Here to follow up an ER visit on 08-06-16 for injuries from a work related fall. He fell about 10 feet from a railing onto another railing. Xrays were negative except for the mid shaft fracture of the right fibula. He had some bruising along the back of the right leg and around the left shoulder. He also had a laceration to the back of the scalp. There was no LOC. He denies headache or nausea or blurred vision, etc. Today he has pain in the right leg and mild pain in the left shoulder. He is seeing Dr. Stann Mainland of [facility] for the fibula fracture. He has been told to avoid weight bearing on the right leg but no means of immobilization is being used.    Review of Systems  Constitutional: Negative.   Respiratory: Negative.   Cardiovascular: Negative.   Musculoskeletal: Positive for arthralgias.  Neurological: Negative.        Objective:   Physical Exam  Constitutional: He is oriented to person, place, and time.  In a wheelchair, alert   Neck: Normal range of motion. Neck supple. No thyromegaly present.  Cardiovascular: Normal rate, regular rhythm, normal heart sounds and intact distal pulses.   Pulmonary/Chest: Effort normal and breath sounds normal. No respiratory distress. He has no wheezes. He has no rales.  Musculoskeletal:  Tender along the lateral right lower leg with slight swelling  Lymphadenopathy:    He has no cervical adenopathy.  Neurological: He is alert and oriented to person, place, and time.  Skin:  Extensive ecchymoses along the posterior right leg. There is a small closed laceration on the occipital scalp with 4 staples in place           Assessment & Plan:  The scalp laceration is healing nicely and all staples are removed today. The left shoulder is bruised a bit but it should heal up fine. He will follow up with Dr. Stann Mainland about the fibula  fracture. Alysia Penna, MD

## 2016-08-12 NOTE — Patient Instructions (Signed)
WE NOW OFFER   [facility] FAST TRACK!!!  SAME DAY Appointments for ACUTE CARE  Such as: Sprains, Injuries, cuts, abrasions, rashes, muscle pain, joint pain, back pain Colds, flu, sore throats, headache, allergies, cough, fever  Ear pain, sinus and eye infections Abdominal pain, nausea, vomiting, diarrhea, upset stomach Animal/insect bites  3 Easy Ways to Schedule: Walk-In Scheduling Call in scheduling Mychart Sign-up: [email or web address]         

## 2016-08-13 ENCOUNTER — Ambulatory Visit (INDEPENDENT_AMBULATORY_CARE_PROVIDER_SITE_OTHER): Payer: BLUE CROSS/BLUE SHIELD | Admitting: Internal Medicine

## 2016-08-13 ENCOUNTER — Encounter: Payer: Self-pay | Admitting: Internal Medicine

## 2016-08-13 VITALS — BP 150/90 | HR 109 | Resp 18 | Ht 68.0 in | Wt 272.0 lb

## 2016-08-13 DIAGNOSIS — G4733 Obstructive sleep apnea (adult) (pediatric): Secondary | ICD-10-CM | POA: Diagnosis not present

## 2016-08-13 DIAGNOSIS — G4734 Idiopathic sleep related nonobstructive alveolar hypoventilation: Secondary | ICD-10-CM | POA: Diagnosis not present

## 2016-08-13 DIAGNOSIS — R911 Solitary pulmonary nodule: Secondary | ICD-10-CM | POA: Diagnosis not present

## 2016-08-13 NOTE — Patient Instructions (Addendum)
Order- new DME, new CPAP auto 5-20, mask of choice, humidifier, supplies, AirView    Dx OSA, complicated by oxygen desaturation, frequent sleep interruption.  Please call as needed  As discussed, I suggest you consider getting a repeat CT chest without contrast in 1 year to check the right lung nodule

## 2016-08-13 NOTE — Progress Notes (Signed)
04/30/2016-51 year old male for sleep medicine consult. Referred by Dr Sarajane Jews. Epworth Score: 8 Medical problems include history depression, GERD. Wife has told him he snores a lot and seems to struggle to breathe in his sleep. Positive family history for OSA. Wife tells him he is very restless, rolling around in bed but without description of leg jerking. Sleep is fragmented and he doesn't feel rested, often sleepy during the day. No history of ENT surgery, lung or respiratory, or heart disease. Father and brother are both on CPAP for OSA. 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 He describes comfortable bedroom.  08/13/16- 51 year old male never smoker followed for OSA, complicated by depression, GERD Unattended Home Sleep Test 05/20/16-AHI 5.3/hour, desaturation to 52% with average saturation 93%, body weight 275 pounds FOLLOW UP AFTER SLEEP STUDY  Since last here he sustained injuries including lower leg fracture in the fall-taking Dilaudid. CT imaging done for trauma evaluation revealed lung nodule. He endorses remote history of pneumonia but no exposure to TB. Wife is here and attests to his daytime sleepiness and loud snoring. We discussed management approaches for mild obstructive sleep apnea symptomatic with disturbed nighttime sleep, daytime sleepiness.  ROS-see HPI   "+" = pos Constitutional:    weight loss, night sweats, fevers, chills, +fatigue, lassitude. HEENT:    headaches, difficulty swallowing, tooth/dental problems, sore throat,       sneezing, itching, ear ache, nasal congestion, post nasal drip, snoring CV:    chest pain, orthopnea, PND, swelling in lower extremities, anasarca,                                                        dzziness, palpitations Resp:   shortness of breath with exertion or at rest.                productive cough,   non-productive cough, coughing up of blood.    change in color of mucus.  wheezing.   Skin:    rash or lesions. GI:  No-   heartburn, indigestion, abdominal pain, nausea, vomiting, diarrhea,                 change in bowel habits, loss of appetite GU: dysuria, change in color of urine, no urgency or frequency.   flank pain. MS:   joint pain, stiffness, decreased range of motion, back pain. Neuro-     nothing unusual Psych:  change in mood or affect.  depression or anxiety.   memory loss.  OBJ- Physical Exam General- Alert, Oriented, Affect-appropriate, Distress- none acute, +heavy set/ over weight Skin- rash-none, lesions- none, excoriation- none Lymphadenopathy- none Head- atraumatic            Eyes- Gross vision intact, PERRLA, conjunctivae and secretions clear            Ears- Hearing, canals-normal            Nose- Clear, no-Septal dev, mucus, polyps, erosion, perforation             Throat- Mallampati III , mucosa clear , drainage- none, tonsils- atrophic Neck- flexible , trachea midline, no stridor , thyroid nl, carotid no bruit Chest - symmetrical excursion , unlabored           Heart/CV- RRR ,  no murmur , no gallop  , no rub, nl s1 s2                           - JVD- none , edema- none, stasis changes- none, varices- none           Lung- clear to P&A, wheeze- none, cough- none , dullness-none, rub- none           Chest wall-  Abd-  Br/ Gen/ Rectal- Not done, not indicated Extrem- cyanosis- none, clubbing, none, atrophy- none, strength- nl, + crutches Neuro- grossly intact to observation

## 2016-08-15 DIAGNOSIS — R911 Solitary pulmonary nodule: Secondary | ICD-10-CM | POA: Insufficient documentation

## 2016-08-15 NOTE — Assessment & Plan Note (Signed)
Importance of weight loss towards normal body weight was emphasized especially in regards to OSA

## 2016-08-15 NOTE — Assessment & Plan Note (Signed)
We discussed available approaches for sleep disordered breathing in this range. He and his wife both assert that quality of life is impacted. He is familiar with CPAP because family members used. We discussed the importance of weight loss, sleeping off flat of back, and conservative measures including chin strap as well as consideration of oral appliance versus CPAP. They both strongly favor CPAP trial. Plan-CPAP with auto titration

## 2016-08-15 NOTE — Assessment & Plan Note (Signed)
Impression-low risk incidental nodule in a nonsmoker Plan-anticipate follow-up CT chest no contrast 1 year

## 2016-08-19 DIAGNOSIS — G4733 Obstructive sleep apnea (adult) (pediatric): Secondary | ICD-10-CM | POA: Diagnosis not present

## 2016-08-20 ENCOUNTER — Other Ambulatory Visit (HOSPITAL_COMMUNITY): Payer: Self-pay | Admitting: Orthopedic Surgery

## 2016-08-20 ENCOUNTER — Ambulatory Visit (HOSPITAL_COMMUNITY)
Admission: RE | Admit: 2016-08-20 | Discharge: 2016-08-20 | Disposition: A | Discharge status: Home / Self Care | Payer: Worker's Compensation | Source: Ambulatory Visit | Attending: Cardiology | Admitting: Cardiology

## 2016-08-20 ENCOUNTER — Encounter (HOSPITAL_COMMUNITY): Payer: Self-pay

## 2016-08-20 DIAGNOSIS — R52 Pain, unspecified: Secondary | ICD-10-CM

## 2016-08-20 DIAGNOSIS — M79606 Pain in leg, unspecified: Secondary | ICD-10-CM | POA: Diagnosis not present

## 2016-08-20 DIAGNOSIS — I82441 Acute embolism and thrombosis of right tibial vein: Secondary | ICD-10-CM | POA: Diagnosis not present

## 2016-08-20 DIAGNOSIS — I82491 Acute embolism and thrombosis of other specified deep vein of right lower extremity: Secondary | ICD-10-CM | POA: Diagnosis not present

## 2016-08-20 DIAGNOSIS — I82401 Acute embolism and thrombosis of unspecified deep veins of right lower extremity: Secondary | ICD-10-CM | POA: Diagnosis not present

## 2016-08-20 DIAGNOSIS — S82832D Other fracture of upper and lower end of left fibula, subsequent encounter for closed fracture with routine healing: Secondary | ICD-10-CM | POA: Diagnosis not present

## 2016-08-20 NOTE — Progress Notes (Signed)
Today's bilateral lower extremity venous duplex is positive for DVT in the right gastrocnemius and distal posterior tibial veins. There is a hematoma in the posterior distal thigh to the proximal calf. Preliminary results given to [facility].

## 2016-09-19 DIAGNOSIS — G4733 Obstructive sleep apnea (adult) (pediatric): Secondary | ICD-10-CM | POA: Diagnosis not present

## 2016-10-12 DIAGNOSIS — S82832D Other fracture of upper and lower end of left fibula, subsequent encounter for closed fracture with routine healing: Secondary | ICD-10-CM | POA: Diagnosis not present

## 2016-11-11 ENCOUNTER — Encounter: Payer: Self-pay | Admitting: Internal Medicine

## 2016-11-13 ENCOUNTER — Ambulatory Visit (INDEPENDENT_AMBULATORY_CARE_PROVIDER_SITE_OTHER): Payer: BLUE CROSS/BLUE SHIELD | Admitting: Internal Medicine

## 2016-11-13 ENCOUNTER — Encounter: Payer: Self-pay | Admitting: Internal Medicine

## 2016-11-13 VITALS — BP 116/80 | HR 66 | Ht 68.0 in | Wt 286.2 lb

## 2016-11-13 DIAGNOSIS — R911 Solitary pulmonary nodule: Secondary | ICD-10-CM

## 2016-11-13 DIAGNOSIS — G4733 Obstructive sleep apnea (adult) (pediatric): Secondary | ICD-10-CM | POA: Diagnosis not present

## 2016-11-13 NOTE — Progress Notes (Signed)
HPI male never smoker followed for OSA, complicated by depression, GERD Unattended Home Sleep Test 05/20/16-AHI 5.3/hour, desaturation to 52% with average saturation 93%, body weight 275 pounds  ----------------------------------------------------------------------------------------------------- 08/13/16- 51 year old male never smoker followed for OSA, complicated by depression, GERD Unattended Home Sleep Test 05/20/16-AHI 5.3/hour, desaturation to 52% with average saturation 93%, body weight 275 pounds FOLLOW UP AFTER SLEEP STUDY  Since last here he sustained injuries including lower leg fracture in the fall-taking Dilaudid. CT imaging done for trauma evaluation revealed lung nodule. He endorses remote history of pneumonia but no exposure to TB. Wife is here and attests to his daytime sleepiness and loud snoring. We discussed management approaches for mild obstructive sleep apnea symptomatic with disturbed nighttime sleep, daytime sleepiness.  11/13/16- 51 year old male never smoker followed for OSA, R lung nodule, complicated by depression, GERD CPAP auto 5-20/ [facility] FOLLOWS FOR: DME: [facility] Pt wears CPAP nightly and DL attached. Pressure works well for patient. New supplies needed as well. He recognizes with CPAP he is sleeping much better, stating much less daytime sleepiness and he didn't realize how tired he was until he began using CPAP. Pressures are comfortable. Download 90% compliance, AHI 0.2/hour.  ROS-see HPI   "+" = pos Constitutional:    weight loss, night sweats, fevers, chills, +fatigue, lassitude. HEENT:    headaches, difficulty swallowing, tooth/dental problems, sore throat,       sneezing, itching, ear ache, nasal congestion, post nasal drip, snoring CV:    chest pain, orthopnea, PND, swelling in lower extremities, anasarca,                                                        dzziness, palpitations Resp:   shortness of breath with exertion or at rest.    productive cough,   non-productive cough, coughing up of blood.              change in color of mucus.  wheezing.   Skin:    rash or lesions. GI:  No-   heartburn, indigestion, abdominal pain, nausea, vomiting, diarrhea,                 change in bowel habits, loss of appetite GU: dysuria, change in color of urine, no urgency or frequency.   flank pain. MS:   joint pain, stiffness, decreased range of motion, back pain. Neuro-     nothing unusual Psych:  change in mood or affect.  depression or anxiety.   memory loss.  OBJ- Physical Exam General- Alert, Oriented, Affect-appropriate, Distress- none acute, +heavy set/ over weight Skin- rash-none, lesions- none, excoriation- none Lymphadenopathy- none Head- atraumatic            Eyes- Gross vision intact, PERRLA, conjunctivae and secretions clear            Ears- Hearing, canals-normal            Nose- Clear, no-Septal dev, mucus, polyps, erosion, perforation             Throat- Mallampati III , mucosa clear , drainage- none, tonsils- atrophic Neck- flexible , trachea midline, no stridor , thyroid nl, carotid no bruit Chest - symmetrical excursion , unlabored           Heart/CV- RRR , no murmur , no gallop  , no rub, nl  s1 s2                           - JVD- none , edema- none, stasis changes- none, varices- none           Lung- clear to P&A, wheeze- none, cough- none , dullness-none, rub- none           Chest wall-  Abd-  Br/ Gen/ Rectal- Not done, not indicated Extrem- cyanosis- none, clubbing, none, atrophy- none, strength- nl, + crutches Neuro- grossly intact to observation

## 2016-11-13 NOTE — Patient Instructions (Signed)
Order- DME advanced- continue CPAP auto 5-20, mask of choice supplies, humidifier, AirView. Needs new supplies now please.      Dx OSA  Please call if we can help

## 2016-11-13 NOTE — Assessment & Plan Note (Signed)
Nodule considered probably benign. He is low risk. Anticipate incidental chest x-ray surveillance over time.

## 2016-11-13 NOTE — Assessment & Plan Note (Signed)
His injured leg is healing and he is more mobile. Take this as an opportunity to push for some effort at weight loss.

## 2016-11-13 NOTE — Assessment & Plan Note (Signed)
He clearly recognizes he is sleeping better and feeling better using CPAP. Tolerating auto pressure 5-20 very well. Download confirms good compliance and control. We discussed comfort measures and compliance goals.

## 2016-11-27 DIAGNOSIS — G4733 Obstructive sleep apnea (adult) (pediatric): Secondary | ICD-10-CM | POA: Diagnosis not present

## 2016-12-17 ENCOUNTER — Encounter: Payer: Self-pay | Admitting: Family Medicine

## 2017-01-23 ENCOUNTER — Emergency Department (HOSPITAL_COMMUNITY): Payer: BLUE CROSS/BLUE SHIELD

## 2017-01-23 ENCOUNTER — Emergency Department (HOSPITAL_COMMUNITY)
Admission: EM | Admit: 2017-01-23 | Discharge: 2017-01-23 | Disposition: A | Discharge status: Home / Self Care | Payer: BLUE CROSS/BLUE SHIELD | Attending: Emergency Medicine | Admitting: Emergency Medicine

## 2017-01-23 ENCOUNTER — Encounter (HOSPITAL_COMMUNITY): Payer: Self-pay | Admitting: Emergency Medicine

## 2017-01-23 DIAGNOSIS — N201 Calculus of ureter: Secondary | ICD-10-CM | POA: Diagnosis not present

## 2017-01-23 DIAGNOSIS — N132 Hydronephrosis with renal and ureteral calculous obstruction: Secondary | ICD-10-CM | POA: Diagnosis not present

## 2017-01-23 DIAGNOSIS — N50812 Left testicular pain: Secondary | ICD-10-CM | POA: Diagnosis not present

## 2017-01-23 DIAGNOSIS — Z79899 Other long term (current) drug therapy: Secondary | ICD-10-CM | POA: Insufficient documentation

## 2017-01-23 DIAGNOSIS — R109 Unspecified abdominal pain: Secondary | ICD-10-CM | POA: Diagnosis not present

## 2017-01-23 DIAGNOSIS — I861 Scrotal varices: Secondary | ICD-10-CM | POA: Diagnosis not present

## 2017-01-23 LAB — COMPREHENSIVE METABOLIC PANEL
ALT: 64 U/L — AB (ref 17–63)
AST: 46 U/L — AB (ref 15–41)
Albumin: 4.3 g/dL (ref 3.5–5.0)
Alkaline Phosphatase: 85 U/L (ref 38–126)
Anion gap: 11 (ref 5–15)
BILIRUBIN TOTAL: 1.1 mg/dL (ref 0.3–1.2)
BUN: 17 mg/dL (ref 6–20)
CALCIUM: 9.7 mg/dL (ref 8.9–10.3)
CO2: 22 mmol/L (ref 22–32)
CREATININE: 1.22 mg/dL (ref 0.61–1.24)
Chloride: 104 mmol/L (ref 101–111)
GFR calc Af Amer: >60 mL/min (ref >60)
Glucose, Bld: 125 mg/dL — ABNORMAL HIGH (ref 65–99)
Potassium: 4.8 mmol/L (ref 3.5–5.1)
Sodium: 137 mmol/L (ref 135–145)
TOTAL PROTEIN: 7.6 g/dL (ref 6.5–8.1)

## 2017-01-23 LAB — CBC WITH DIFFERENTIAL/PLATELET
BASOS ABS: 0 10*3/uL (ref 0.0–0.1)
Basophils Relative: 1 %
EOS ABS: 0.2 10*3/uL (ref 0.0–0.7)
EOS PCT: 3 %
HCT: 45.5 % (ref 39.0–52.0)
Hemoglobin: 15.7 g/dL (ref 13.0–17.0)
Lymphocytes Relative: 17 %
Lymphs Abs: 1.4 10*3/uL (ref 0.7–4.0)
MCH: 30 pg (ref 26.0–34.0)
MCHC: 34.5 g/dL (ref 30.0–36.0)
MCV: 87 fL (ref 78.0–100.0)
Monocytes Absolute: 0.5 10*3/uL (ref 0.1–1.0)
Monocytes Relative: 6 %
Neutro Abs: 5.8 10*3/uL (ref 1.7–7.7)
Neutrophils Relative %: 73 %
PLATELETS: 255 10*3/uL (ref 150–400)
RBC: 5.23 MIL/uL (ref 4.22–5.81)
RDW: 13.2 % (ref 11.5–15.5)
WBC: 7.9 10*3/uL (ref 4.0–10.5)

## 2017-01-23 LAB — URINALYSIS, ROUTINE W REFLEX MICROSCOPIC
BILIRUBIN URINE: NEGATIVE
GLUCOSE, UA: NEGATIVE mg/dL
Hgb urine dipstick: NEGATIVE
KETONES UR: 20 mg/dL — AB
LEUKOCYTES UA: NEGATIVE
Nitrite: NEGATIVE
PH: 9 — AB (ref 5.0–8.0)
Protein, ur: 30 mg/dL — AB
SPECIFIC GRAVITY, URINE: 1.021 (ref 1.005–1.030)
Squamous Epithelial / LPF: NONE SEEN

## 2017-01-23 MED ORDER — KETOROLAC TROMETHAMINE 30 MG/ML IJ SOLN
15.0000 mg | Freq: Once | INTRAMUSCULAR | Status: AC
  Administered 2017-01-23: 15 mg via INTRAVENOUS
  Filled 2017-01-23: qty 1

## 2017-01-23 MED ORDER — SODIUM CHLORIDE 0.9 % IV BOLUS (SEPSIS)
1000.0000 mL | Freq: Once | INTRAVENOUS | Status: AC
  Administered 2017-01-23: 1000 mL via INTRAVENOUS

## 2017-01-23 MED ORDER — ONDANSETRON 4 MG PO TBDP: 4.0000 mg | ORAL_TABLET | Freq: Three times a day (TID) | ORAL | 0 refills | Status: DC | PRN

## 2017-01-23 MED ORDER — HYDROMORPHONE HCL 1 MG/ML IJ SOLN
0.5000 mg | Freq: Once | INTRAMUSCULAR | Status: AC
  Administered 2017-01-23: 0.5 mg via INTRAVENOUS
  Filled 2017-01-23: qty 1

## 2017-01-23 MED ORDER — ONDANSETRON HCL 4 MG/2ML IJ SOLN
4.0000 mg | Freq: Once | INTRAMUSCULAR | Status: AC
  Administered 2017-01-23: 4 mg via INTRAVENOUS
  Filled 2017-01-23: qty 2

## 2017-01-23 MED ORDER — TAMSULOSIN HCL 0.4 MG PO CAPS: 0.4000 mg | ORAL_CAPSULE | Freq: Every day | ORAL | 0 refills | Status: DC

## 2017-01-23 MED ORDER — OXYCODONE-ACETAMINOPHEN 5-325 MG PO TABS
ORAL_TABLET | ORAL | Status: AC
  Filled 2017-01-23: qty 1

## 2017-01-23 MED ORDER — HYDROCODONE-ACETAMINOPHEN 5-325 MG PO TABS: 2.0000 | ORAL_TABLET | ORAL | 0 refills | Status: DC | PRN

## 2017-01-23 MED ORDER — OXYCODONE-ACETAMINOPHEN 5-325 MG PO TABS
1.0000 | ORAL_TABLET | ORAL | Status: DC | PRN
  Administered 2017-01-23: 1 via ORAL

## 2017-01-23 MED ORDER — MORPHINE SULFATE (PF) 4 MG/ML IV SOLN
4.0000 mg | Freq: Once | INTRAVENOUS | Status: AC
  Administered 2017-01-23: 4 mg via INTRAVENOUS
  Filled 2017-01-23: qty 1

## 2017-01-23 NOTE — ED Provider Notes (Signed)
[facility] EMERGENCY DEPARTMENT Provider Note   CSN: [id number] Arrival date & time: 01/23/17  3235     History   Chief Complaint Chief Complaint  Patient presents with  . Groin Pain  . Back Pain    HPI Benjamin Cunningham is a 51 y.o. male.  HPI 51 year old Caucasian male with no pertinent past medical history presents to the ED with complaints of left testicular pain, left lower quadrant pain and left flank pain.  Acute onset this morning upon waking.  Patient states the pain is constant but the intensity is intermittent.  Has not driving with pain prior to arrival.  Denies any associated urinary symptoms, emesis.  Does report some mild nausea.  Denies any associated fevers.  Denies any change in his bowel habits including diarrhea, melena, hematochezia.  No history of same.  Nothing makes better or worse. Past Medical History:  Diagnosis Date  . GERD (gastroesophageal reflux disease)   . History of seasonal allergies     Patient Active Problem List   Diagnosis Date Noted  . Lung nodule 08/15/2016  . Morbid (severe) obesity due to excess calories ([place]) 08/15/2016  . Obstructive sleep apnea 04/30/2016  . SORE THROAT 04/23/2009  . VIRAL URI 04/23/2009  . DEPRESSION 05/10/2007  . PNEUMONIA 05/10/2007    Past Surgical History:  Procedure Laterality Date  . WISDOM TOOTH EXTRACTION     around early 6s       Home Medications    Prior to Admission medications   Medication Sig Start Date End Date Taking? Authorizing Provider  cetirizine (ZYRTEC) 10 MG tablet Take 10 mg by mouth daily.   Yes [provider]  ibuprofen (ADVIL,MOTRIN) 200 MG tablet Take 600 mg by mouth every 12 (twelve) hours as needed (for pain).    Yes [provider]  Multiple Vitamins-Minerals (CENTRUM SILVER 50+MEN) TABS Take 1 tablet by mouth daily.   Yes [provider]  omeprazole (PRILOSEC) 40 MG capsule Take 1 capsule (40 mg total) by mouth daily.  04/14/16  Yes Laurey Morale, MD  HYDROcodone-acetaminophen (NORCO/VICODIN) 5-325 MG tablet Take 2 tablets by mouth every 4 (four) hours as needed. 01/23/17   Ocie Cornfield T, PA-C  ondansetron (ZOFRAN-ODT) 4 MG disintegrating tablet Take 1 tablet (4 mg total) by mouth every 8 (eight) hours as needed for nausea. 01/23/17   Doristine Devoid, PA-C  tamsulosin (FLOMAX) 0.4 MG CAPS capsule Take 1 capsule (0.4 mg total) by mouth daily. 01/23/17   Doristine Devoid, PA-C    Family History Family History  Problem Relation Age of Onset  . Cervical cancer Mother   . Breast cancer Maternal Grandmother   . Liver cancer Maternal Grandmother   . Colon cancer Neg Hx     Social History Social History  Substance Use Topics  . Smoking status: Never Smoker  . Smokeless tobacco: Never Used  . Alcohol use Yes     Comment: 1-2 drinks per month     Allergies   Patient has no known allergies.   Review of Systems Review of Systems  Constitutional: Negative for chills and fever.  HENT: Negative for congestion and sore throat.   Eyes: Negative for visual disturbance.  Respiratory: Negative for cough and shortness of breath.   Cardiovascular: Negative for chest pain.  Gastrointestinal: Positive for abdominal pain and nausea. Negative for diarrhea and vomiting.  Genitourinary: Positive for flank pain and testicular pain. Negative for dysuria, frequency, hematuria, scrotal swelling and  urgency.  Musculoskeletal: Negative for arthralgias and myalgias.  Skin: Negative for rash.  Neurological: Negative for dizziness, syncope, weakness, light-headedness, numbness and headaches.  Psychiatric/Behavioral: Negative for sleep disturbance. The patient is not nervous/anxious.      Physical Exam Updated Vital Signs BP 133/90   Pulse 83   Temp 97.7 F (36.5 C) (Oral)   Resp 16   Ht 5\' 8"  (1.727 m)   Wt 129.7 kg (286 lb)   SpO2 95%   BMI 43.49 kg/m   Physical Exam  Constitutional: He is  oriented to person, place, and time. He appears well-developed and well-nourished.  Non-toxic appearance. No distress.  Patient appears uncomfortable on exam and is slightly diaphoretic and pale.  Patient holding his left flank.  HENT:  Head: Normocephalic and atraumatic.  Mouth/Throat: Oropharynx is clear and moist.  Eyes: Pupils are equal, round, and reactive to light. Conjunctivae are normal. Right eye exhibits no discharge. Left eye exhibits no discharge.  Neck: Normal range of motion. Neck supple.  Cardiovascular: Normal rate, regular rhythm, normal heart sounds and intact distal pulses.  Exam reveals no gallop and no friction rub.   No murmur heard. Pulmonary/Chest: Effort normal and breath sounds normal. No respiratory distress. He exhibits no tenderness.  Abdominal: Soft. Bowel sounds are normal. He exhibits no distension. There is tenderness in the left lower quadrant. There is CVA tenderness (left). There is no rigidity, no rebound, no guarding, no tenderness at McBurney's point and negative Murphy's sign.  Genitourinary:  Genitourinary Comments: Chaperone present for exam.  Patient is circumcised.  No penile discharge, erythema, tenderness, lesions or rash.  2 descended testicles without any edema of the scrotum.  He does have some pain with palpation of the left testicle.  No inguinal lymphadenopathy or hernia appreciated.  Musculoskeletal: Normal range of motion. He exhibits no tenderness.  Lymphadenopathy:    He has no cervical adenopathy.  Neurological: He is alert and oriented to person, place, and time.  Skin: Skin is warm and dry. Capillary refill takes less than 2 seconds. No rash noted.  Psychiatric: His behavior is normal. Judgment and thought content normal.  Nursing note and vitals reviewed.    ED Treatments / Results  Labs (all labs ordered are listed, but only abnormal results are displayed) Labs Reviewed  COMPREHENSIVE METABOLIC PANEL - Abnormal; Notable for the  following:       Result Value   Glucose, Bld 125 (*)    AST 46 (*)    ALT 64 (*)    All other components within normal limits  URINALYSIS, ROUTINE W REFLEX MICROSCOPIC - Abnormal; Notable for the following:    pH 9.0 (*)    Ketones, ur 20 (*)    Protein, ur 30 (*)    Bacteria, UA RARE (*)    All other components within normal limits  CBC WITH DIFFERENTIAL/PLATELET    EKG  EKG Interpretation None       Radiology US Scrotum  Result Date: 01/23/2017 CLINICAL DATA:  Left testicular pain since 8 a.m. EXAM: SCROTAL ULTRASOUND DOPPLER ULTRASOUND OF THE TESTICLES TECHNIQUE: Complete ultrasound examination of the testicles, epididymis, and other scrotal structures was performed. Color and spectral Doppler ultrasound were also utilized to evaluate blood flow to the testicles. COMPARISON:  01/23/2017 CT FINDINGS: Right testicle Measurements: 4.5 x 2.2 x 2.9 cm. No mass or microlithiasis visualized. Left testicle Measurements: 4.3 x 2.4 x 2.6 cm. No mass or microlithiasis visualized. Right epididymis:  Normal in size and  appearance. Left epididymis:  Normal in size and appearance. Hydrocele:  None visualized. Varicocele:  Left varicocele. Pulsed Doppler interrogation of both testes demonstrates normal low resistance arterial and venous waveforms bilaterally. IMPRESSION: 1. No evidence for testicular torsion or mass. 2. Left varicocele. Electronically Signed   By: Nolon Nations M.D.   On: 01/23/2017 12:34   Ct Renal Stone Study  Result Date: 01/23/2017 CLINICAL DATA:  Left flank pain since last night. No history of kidney stones. Gastroesophageal reflux disease. EXAM: CT ABDOMEN AND PELVIS WITHOUT CONTRAST TECHNIQUE: Multidetector CT imaging of the abdomen and pelvis was performed following the standard protocol without IV contrast. COMPARISON:  Abdominopelvic CT 08/06/2016. FINDINGS: Lower chest: Clear lung bases. No pleural or pericardial effusion. The distal esophagus is fluid filled but only  minimally dilated. Hepatobiliary: Severe hepatic steatosis. No focal lesion identified on noncontrast imaging. No evidence of gallstones, gallbladder wall thickening or biliary dilatation. Pancreas: Unremarkable. No pancreatic ductal dilatation or surrounding inflammatory changes. Spleen: Normal in size without focal abnormality. Adrenals/Urinary Tract: Both adrenal glands appear normal. The right kidney appears normal. On the left, there is perinephric soft tissue stranding and moderate hydronephrosis secondary to a 1-2 mm calculus at the left ureterovesical junction (image 81). No other urinary tract calculi are seen. The bladder appears unremarkable. Stomach/Bowel: No evidence of bowel wall thickening, distention or surrounding inflammatory change. The appendix appears normal. Vascular/Lymphatic: There are no enlarged abdominal or pelvic lymph nodes. No significant vascular findings on noncontrast imaging. Reproductive: The prostate gland and seminal vesicles appear unremarkable. Other: No evidence of abdominal wall mass or hernia. No ascites. Musculoskeletal: No acute or significant osseous findings. Multiple incompletely healed rib fractures are again noted laterally on the left, stable. IMPRESSION: 1. Obstructing 1-2 mm calculus at the left ureterovesical junction. Associated hydronephrosis and perinephric soft tissue stranding. 2. No other urinary tract calculi. 3. Severe hepatic steatosis. 4. Fluid-filled distal esophagus consistent with given history of reflux. Electronically Signed   By: Richardean Sale M.D.   On: 01/23/2017 11:48   [place] Scrotom Doppler  Result Date: 01/23/2017 CLINICAL DATA:  Left testicular pain since 8 a.m. EXAM: SCROTAL ULTRASOUND DOPPLER ULTRASOUND OF THE TESTICLES TECHNIQUE: Complete ultrasound examination of the testicles, epididymis, and other scrotal structures was performed. Color and spectral Doppler ultrasound were also utilized to evaluate blood flow to the testicles.  COMPARISON:  None. FINDINGS: Right testicle Measurements: 4.5 x 2.2 x 2.9 cm. No mass or microlithiasis visualized. Left testicle Measurements: 4.3 x 2.4 x 2.6 cm. No mass or microlithiasis visualized. Right epididymis:  Normal in size and appearance. Left epididymis:  Normal in size and appearance. Hydrocele:  None visualized. Varicocele:  Left-sided varicocele Pulsed Doppler interrogation of both testes demonstrates normal low resistance arterial and venous waveforms bilaterally. IMPRESSION: 1. Both testicles appear normal. No evidence of testicular torsion or orchitis. 2. No evidence of epididymitis. 3. Left-sided varicocele, possible source for patient's testicular pain. Electronically Signed   By: Franki Cabot M.D.   On: 01/23/2017 12:34    Procedures Procedures (including critical care time)  Medications Ordered in ED Medications  sodium chloride 0.9 % bolus 1,000 mL (0 mLs Intravenous Stopped 01/23/17 1120)  morphine 4 MG/ML injection 4 mg (4 mg Intravenous Given 01/23/17 1110)  ondansetron (ZOFRAN) injection 4 mg (4 mg Intravenous Given 01/23/17 1110)  ketorolac (TORADOL) 30 MG/ML injection 15 mg (15 mg Intravenous Given 01/23/17 1304)  HYDROmorphone (DILAUDID) injection 0.5 mg (0.5 mg Intravenous Given 01/23/17 1359)  Initial Impression / Assessment and Plan / ED Course  I have reviewed the triage vital signs and the nursing notes.  Pertinent labs & imaging results that were available during my care of the patient were reviewed by me and considered in my medical decision making (see chart for details).     Patient presents to the ED with complaints of left flank pain, left lower quadrant pain, left testicular pain.  Denies any associated urinary symptoms, change in bowel habits, fever, vomiting.  Pt has been diagnosed with a Kidney Stone via CT. There is no evidence of significant hydronephrosis, serum creatine WNL, vitals sign stable and the pt does not have irratractable  vomiting.  Ultrasound of testicles are unremarkable.  Liver enzymes are mildly elevated however at patient's baseline.  Patient's pain managed in the ED.  Denies any pain at this time.  Patient has no vomiting is able to tolerate p.o. fluids.  Patient is afebrile.  Urine shows no signs of infection.  Pt will be dc home with pain medications & has been advised to follow up with PCP.    Final Clinical Impressions(s) / ED Diagnoses   Final diagnoses:  Ureterolithiasis    New Prescriptions Discharge Medication List as of 01/23/2017  2:03 PM    START taking these medications   Details  ondansetron (ZOFRAN-ODT) 4 MG disintegrating tablet Take 1 tablet (4 mg total) by mouth every 8 (eight) hours as needed for nausea., Starting Sat 01/23/2017, Print    tamsulosin (FLOMAX) 0.4 MG CAPS capsule Take 1 capsule (0.4 mg total) by mouth daily., Starting Sat 01/23/2017, Print         Doristine Devoid, PA-C 01/23/17 2315    Gareth Morgan, MD 01/28/17 1131

## 2017-01-23 NOTE — ED Triage Notes (Signed)
Pt c/o left sided groin pain that radiates into low back onset this morning. Pt denies urinary symptoms.

## 2017-01-23 NOTE — Discharge Instructions (Signed)
You have been diagnosed with kidney stones.  Drink plenty of fluids to help you pass the stone.  Take  ibuprofen / naproxen as directed with food for mild to moderate pain. Use your pain medication as directed and only as needed for severe pain. Taking flomax as directed will also help to pass the stone. Use Zofran for nausea as directed.  Follow up with the urology clinic listed in regards to your hospital visit.   Return to the ED immediately if you develop fever that persists > 101, uncontrolled pain or vomiting, or other concerns.   Do not drink alcohol, drive or participate in any other potentially dangerous activities while taking opiate pain medication as it may make you sleepy. Do not take this medication with any other sedating medications, either prescription or over-the-counter. If you were prescribed Percocet or Vicodin, do not take these with acetaminophen (Tylenol) as it is already contained within these medications.   This medication is an opiate (or narcotic) pain medication and can be habit forming.  Use it as little as possible to achieve adequate pain control.  Do not use or use it with extreme caution if you have a history of opiate abuse or dependence. This medication is intended for your use only - do not give any to anyone else and keep it in a secure place where nobody else, especially children, have access to it. It will also cause or worsen constipation, so you may want to consider taking an over-the-counter stool softener while you are taking this medication.  

## 2017-03-08 DIAGNOSIS — G4733 Obstructive sleep apnea (adult) (pediatric): Secondary | ICD-10-CM | POA: Diagnosis not present

## 2017-03-28 ENCOUNTER — Other Ambulatory Visit: Payer: Self-pay | Admitting: Family Medicine

## 2017-04-01 ENCOUNTER — Ambulatory Visit (INDEPENDENT_AMBULATORY_CARE_PROVIDER_SITE_OTHER)
Admission: RE | Admit: 2017-04-01 | Discharge: 2017-04-01 | Disposition: A | Discharge status: Home / Self Care | Payer: BLUE CROSS/BLUE SHIELD | Source: Ambulatory Visit | Attending: Family Medicine | Admitting: Family Medicine

## 2017-04-01 ENCOUNTER — Encounter: Payer: Self-pay | Admitting: Family Medicine

## 2017-04-01 ENCOUNTER — Ambulatory Visit: Payer: BLUE CROSS/BLUE SHIELD | Admitting: Family Medicine

## 2017-04-01 VITALS — BP 112/70 | HR 81 | Temp 99.9°F | Ht 68.0 in | Wt 274.8 lb

## 2017-04-01 DIAGNOSIS — R05 Cough: Secondary | ICD-10-CM | POA: Diagnosis not present

## 2017-04-01 DIAGNOSIS — R059 Cough, unspecified: Secondary | ICD-10-CM

## 2017-04-01 DIAGNOSIS — J111 Influenza due to unidentified influenza virus with other respiratory manifestations: Secondary | ICD-10-CM | POA: Diagnosis not present

## 2017-04-01 DIAGNOSIS — R0602 Shortness of breath: Secondary | ICD-10-CM | POA: Diagnosis not present

## 2017-04-01 MED ORDER — BENZONATATE 100 MG PO CAPS: 100.0000 mg | ORAL_CAPSULE | Freq: Three times a day (TID) | ORAL | 0 refills | Status: DC | PRN

## 2017-04-01 MED ORDER — ALBUTEROL SULFATE HFA 108 (90 BASE) MCG/ACT IN AERS: 2.0000 | INHALATION_SPRAY | Freq: Four times a day (QID) | RESPIRATORY_TRACT | 0 refills | Status: DC | PRN

## 2017-04-01 NOTE — Progress Notes (Signed)
HPI:  Acute visit for flu like symptoms. Started 4-5 days ago. Symptoms included fever up to 104 the first day - now resolved, nasal congestion, body aches, cough, nausea and vomiting initially. Now better but with persistent cough with occ wheezy feeling.  ROS: See pertinent positives and negatives per HPI.  Past Medical History:  Diagnosis Date  . GERD (gastroesophageal reflux disease)   . History of seasonal allergies     Past Surgical History:  Procedure Laterality Date  . WISDOM TOOTH EXTRACTION     around early 59s    Family History  Problem Relation Age of Onset  . Cervical cancer Mother   . Breast cancer Maternal Grandmother   . Liver cancer Maternal Grandmother   . Colon cancer Neg Hx     Social History   Socioeconomic History  . Marital status: Married    Spouse name: None  . Number of children: None  . Years of education: None  . Highest education level: None  Social Needs  . Financial resource strain: None  . Food insecurity - worry: None  . Food insecurity - inability: None  . Transportation needs - medical: None  . Transportation needs - non-medical: None  Occupational History  . Occupation: VP of Operations  Tobacco Use  . Smoking status: Never Smoker  . Smokeless tobacco: Never Used  Substance and Sexual Activity  . Alcohol use: Yes    Comment: 1-2 drinks per month  . Drug use: No  . Sexual activity: None  Other Topics Concern  . None  Social History Narrative  . None     Current Outpatient Medications:  .  cetirizine (ZYRTEC) 10 MG tablet, Take 10 mg by mouth daily., Disp: , Rfl:  .  HYDROcodone-acetaminophen (NORCO/VICODIN) 5-325 MG tablet, Take 2 tablets by mouth every 4 (four) hours as needed., Disp: 15 tablet, Rfl: 0 .  ibuprofen (ADVIL,MOTRIN) 200 MG tablet, Take 600 mg by mouth every 12 (twelve) hours as needed (for pain). , Disp: , Rfl:  .  Multiple Vitamins-Minerals (CENTRUM SILVER 50+MEN) TABS, Take 1 tablet by mouth daily.,  Disp: , Rfl:  .  omeprazole (PRILOSEC) 40 MG capsule, TAKE 1 CAPSULE(40 MG) BY MOUTH DAILY, Disp: 90 capsule, Rfl: 0 .  ondansetron (ZOFRAN-ODT) 4 MG disintegrating tablet, Take 1 tablet (4 mg total) by mouth every 8 (eight) hours as needed for nausea., Disp: 8 tablet, Rfl: 0 .  tamsulosin (FLOMAX) 0.4 MG CAPS capsule, Take 1 capsule (0.4 mg total) by mouth daily., Disp: 30 capsule, Rfl: 0 .  albuterol (PROVENTIL HFA;VENTOLIN HFA) 108 (90 Base) MCG/ACT inhaler, Inhale 2 puffs into the lungs every 6 (six) hours as needed., Disp: 1 Inhaler, Rfl: 0 .  benzonatate (TESSALON PERLES) 100 MG capsule, Take 1 capsule (100 mg total) by mouth 3 (three) times daily as needed., Disp: 20 capsule, Rfl: 0  Current Facility-Administered Medications:  .  0.9 %  sodium chloride infusion, 500 mL, Intravenous, Continuous, Nandigam, Kavitha V, MD  EXAM:  Vitals:   04/01/17 1353  BP: 112/70  Pulse: 81  Temp: 99.9 F (37.7 C)  SpO2: 95%    Body mass index is 41.78 kg/m.  GENERAL: vitals reviewed and listed above, alert, oriented, appears well hydrated and in no acute distress  HEENT: atraumatic, conjunttiva clear, no obvious abnormalities on inspection of external nose and ears, normal appearance of ear canals and TMs, clear nasal congestion, mild post oropharyngeal erythema with PND, no tonsillar edema or exudate, no sinus TTP  NECK: no obvious masses on inspection  LUNGS: clear to auscultation bilaterally, no wheezes, rales or rhonchi, good air movement  CV: HRRR, no peripheral edema  MS: moves all extremities without noticeable abnormality  PSYCH: pleasant and cooperative, no obvious depression or anxiety  ASSESSMENT AND PLAN:  Discussed the following assessment and plan:  Cough - Plan: DG Chest 2 View  Influenza - Plan: DG Chest 2 View  -likely influenza or other VURI -discussed risks/benefit tamiflu and given out of treatment window for likely benefit he declined, will do CXR to exclude  LRI and treat accordingly, tessalon for cough after discussion risks, alb if wheezy, return and emergency precautions and otc options for symptomatic care discussed. -Patient advised to return or notify a doctor immediately if symptoms worsen or persist or new concerns arise.  Patient Instructions  Go get the chest xray.  Tessalon for cough as needed. Albuterol per instructions if wheezy.  Seek care immediatly if worsening or trouble breathing or not improving.   Influenza, Adult Influenza ("the flu") is an infection in the lungs, nose, and throat (respiratory tract). It is caused by a virus. The flu causes many common cold symptoms, as well as a high fever and body aches. It can make you feel very sick. The flu spreads easily from person to person (is contagious). Getting a flu shot (influenza vaccination) every year is the best way to prevent the flu. Follow these instructions at home:  Take over-the-counter and prescription medicines only as told by your doctor.  Use a cool mist humidifier to add moisture (humidity) to the air in your home. This can make it easier to breathe.  Rest as needed.  Drink enough fluid to keep your pee (urine) clear or pale yellow.  Cover your mouth and nose when you cough or sneeze.  Wash your hands with soap and water often, especially after you cough or sneeze. If you cannot use soap and water, use hand sanitizer.  Stay home from work or school as told by your doctor. Unless you are visiting your doctor, try to avoid leaving home until your fever has been gone for 24 hours without the use of medicine.  Keep all follow-up visits as told by your doctor. This is important. How is this prevented?  Getting a yearly (annual) flu shot is the best way to avoid getting the flu. You may get the flu shot in late summer, fall, or winter. Ask your doctor when you should get your flu shot.  Wash your hands often or use hand sanitizer often.  Avoid contact with  people who are sick during cold and flu season.  Eat healthy foods.  Drink plenty of fluids.  Get enough sleep.  Exercise regularly. Contact a doctor if:  You get new symptoms.  You have: ? Chest pain. ? Watery poop (diarrhea). ? A fever.  Your cough gets worse.  You start to have more mucus.  You feel sick to your stomach (nauseous).  You throw up (vomit). Get help right away if:  You start to be short of breath or have trouble breathing.  Your skin or nails turn a bluish color.  You have very bad pain or stiffness in your neck.  You get a sudden headache.  You get sudden pain in your face or ear.  You cannot stop throwing up. This information is not intended to replace advice given to you by your health care provider. Make sure you discuss any questions you have with  your health care provider. Document Released: 12/24/2007 Document Revised: 08/22/2015 Document Reviewed: 01/08/2015 Elsevier Interactive Patient Education  2017 [place]., DO

## 2017-04-01 NOTE — Patient Instructions (Signed)
Go get the chest xray.  Tessalon for cough as needed. Albuterol per instructions if wheezy.  Seek care immediatly if worsening or trouble breathing or not improving.   Influenza, Adult Influenza ("the flu") is an infection in the lungs, nose, and throat (respiratory tract). It is caused by a virus. The flu causes many common cold symptoms, as well as a high fever and body aches. It can make you feel very sick. The flu spreads easily from person to person (is contagious). Getting a flu shot (influenza vaccination) every year is the best way to prevent the flu. Follow these instructions at home:  Take over-the-counter and prescription medicines only as told by your doctor.  Use a cool mist humidifier to add moisture (humidity) to the air in your home. This can make it easier to breathe.  Rest as needed.  Drink enough fluid to keep your pee (urine) clear or pale yellow.  Cover your mouth and nose when you cough or sneeze.  Wash your hands with soap and water often, especially after you cough or sneeze. If you cannot use soap and water, use hand sanitizer.  Stay home from work or school as told by your doctor. Unless you are visiting your doctor, try to avoid leaving home until your fever has been gone for 24 hours without the use of medicine.  Keep all follow-up visits as told by your doctor. This is important. How is this prevented?  Getting a yearly (annual) flu shot is the best way to avoid getting the flu. You may get the flu shot in late summer, fall, or winter. Ask your doctor when you should get your flu shot.  Wash your hands often or use hand sanitizer often.  Avoid contact with people who are sick during cold and flu season.  Eat healthy foods.  Drink plenty of fluids.  Get enough sleep.  Exercise regularly. Contact a doctor if:  You get new symptoms.  You have: ? Chest pain. ? Watery poop (diarrhea). ? A fever.  Your cough gets worse.  You start to have  more mucus.  You feel sick to your stomach (nauseous).  You throw up (vomit). Get help right away if:  You start to be short of breath or have trouble breathing.  Your skin or nails turn a bluish color.  You have very bad pain or stiffness in your neck.  You get a sudden headache.  You get sudden pain in your face or ear.  You cannot stop throwing up. This information is not intended to replace advice given to you by your health care provider. Make sure you discuss any questions you have with your health care provider. Document Released: 12/24/2007 Document Revised: 08/22/2015 Document Reviewed: 01/08/2015 Elsevier Interactive Patient Education  2017 [employer].

## 2017-04-02 ENCOUNTER — Other Ambulatory Visit: Payer: Self-pay | Admitting: *Deleted

## 2017-04-02 MED ORDER — PREDNISONE 20 MG PO TABS: 40.0000 mg | ORAL_TABLET | Freq: Every day | ORAL | 0 refills | Status: DC

## 2017-04-02 NOTE — Telephone Encounter (Signed)
Rx done-see results note. 

## 2017-04-07 ENCOUNTER — Encounter: Payer: Self-pay | Admitting: Family Medicine

## 2017-04-07 ENCOUNTER — Ambulatory Visit: Payer: BLUE CROSS/BLUE SHIELD | Admitting: Family Medicine

## 2017-04-07 ENCOUNTER — Ambulatory Visit: Payer: Self-pay | Admitting: Family Medicine

## 2017-04-07 VITALS — BP 108/70 | HR 85 | Temp 98.6°F | Wt 269.8 lb

## 2017-04-07 DIAGNOSIS — J209 Acute bronchitis, unspecified: Secondary | ICD-10-CM

## 2017-04-07 MED ORDER — AZITHROMYCIN 250 MG PO TABS: ORAL_TABLET | ORAL | 0 refills | Status: DC

## 2017-04-07 MED ORDER — HYDROCODONE-HOMATROPINE 5-1.5 MG/5ML PO SYRP: 5.0000 mL | ORAL_SOLUTION | ORAL | 0 refills | Status: DC | PRN

## 2017-04-07 NOTE — Progress Notes (Signed)
   Subjective:    Patient ID: Benjamin Cunningham, male    DOB: [birth year], 52 y.o.   MRN: [id number]  HPI Here for 10 days of chest congestion and coughing up green sputum. He had fevers to 104 degrees at first but the fevers have subsided. He was seen here on 04-01-17 and was felt to have a viral URI. His CXR indicated bronchitis. He was given prednisone and benzonatate, but he has not improved much. Using an inhaler at times.    Review of Systems  Constitutional: Negative.   HENT: Negative.   Eyes: Negative.   Respiratory: Positive for cough, chest tightness, shortness of breath and wheezing.        Objective:   Physical Exam  Constitutional: He appears well-developed and well-nourished.  HENT:  Right Ear: External ear normal.  Left Ear: External ear normal.  Nose: Nose normal.  Mouth/Throat: Oropharynx is clear and moist.  Eyes: Conjunctivae are normal.  Neck: No thyromegaly present.  Pulmonary/Chest: Effort normal. No respiratory distress. He has no rales.  Scattered rhonchi and wheezes   Lymphadenopathy:    He has no cervical adenopathy.          Assessment & Plan:  Bronchitis, treat with a Zpack.  Alysia Penna, MD

## 2017-06-09 DIAGNOSIS — S61002A Unspecified open wound of left thumb without damage to nail, initial encounter: Secondary | ICD-10-CM | POA: Diagnosis not present

## 2017-06-14 DIAGNOSIS — S61002D Unspecified open wound of left thumb without damage to nail, subsequent encounter: Secondary | ICD-10-CM | POA: Diagnosis not present

## 2017-06-28 ENCOUNTER — Other Ambulatory Visit: Payer: Self-pay | Admitting: Family Medicine

## 2017-08-30 ENCOUNTER — Encounter: Payer: Self-pay | Admitting: Internal Medicine

## 2017-10-06 DIAGNOSIS — S299XXA Unspecified injury of thorax, initial encounter: Secondary | ICD-10-CM | POA: Diagnosis not present

## 2017-11-16 ENCOUNTER — Ambulatory Visit: Payer: Self-pay | Admitting: Internal Medicine

## 2018-03-08 DIAGNOSIS — D3131 Benign neoplasm of right choroid: Secondary | ICD-10-CM | POA: Diagnosis not present

## 2018-03-18 ENCOUNTER — Other Ambulatory Visit: Payer: Self-pay | Admitting: *Deleted

## 2018-03-18 MED ORDER — OMEPRAZOLE 40 MG PO CPDR: 40.0000 mg | DELAYED_RELEASE_CAPSULE | Freq: Every day | ORAL | 0 refills | Status: DC

## 2018-06-05 IMAGING — CT CT RENAL STONE PROTOCOL
2 of 4 series · 16 of 46 positions shown, 18 images · non-contrast
Comparison: Abdominopelvic CT 08/06/2016.

CLINICAL DATA: Left flank pain since last night. No history of
kidney stones. Gastroesophageal reflux disease.

EXAM:
CT ABDOMEN AND PELVIS WITHOUT CONTRAST
TECHNIQUE: Multidetector CT imaging of the abdomen and pelvis was performed
following the standard protocol without IV contrast.

[Series 3: renal stone 5.0 · axial · 0.78mm/px · z∈[+882,+1342]mm · 13 of 100 slices shown, 15 images]
[im 4/100  soft-tissue]
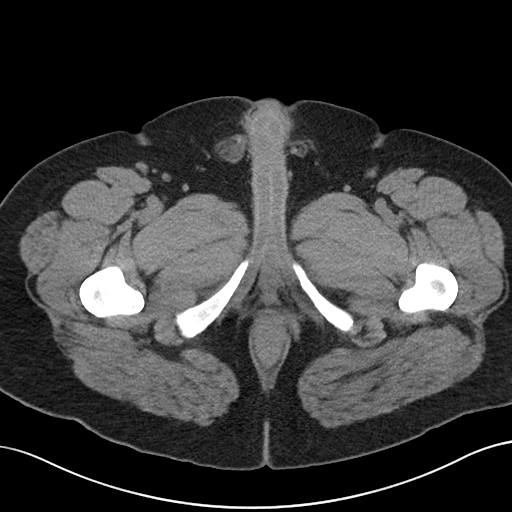
[im 4/100  bone]
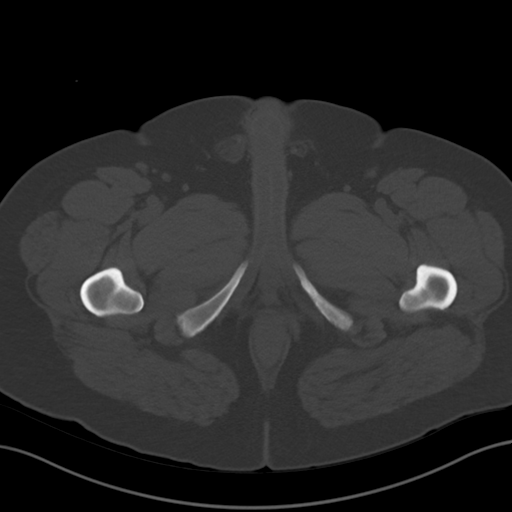
[im 12/100  soft-tissue]
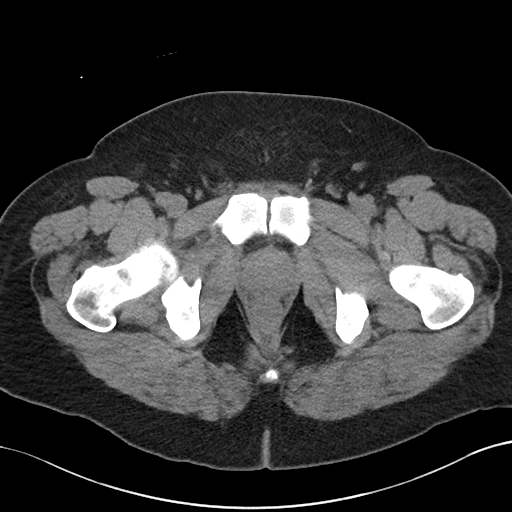
[im 20/100  soft-tissue]
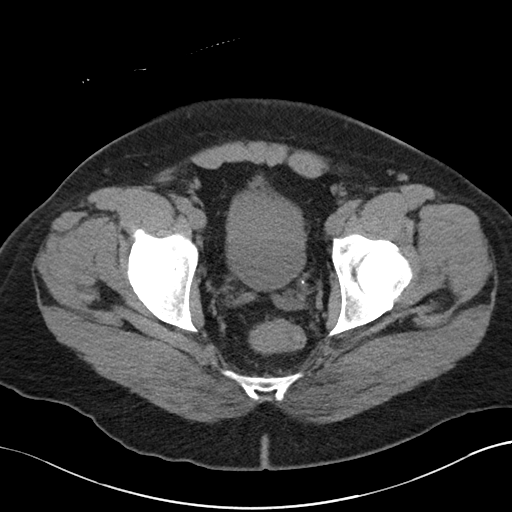
[im 28/100  soft-tissue]
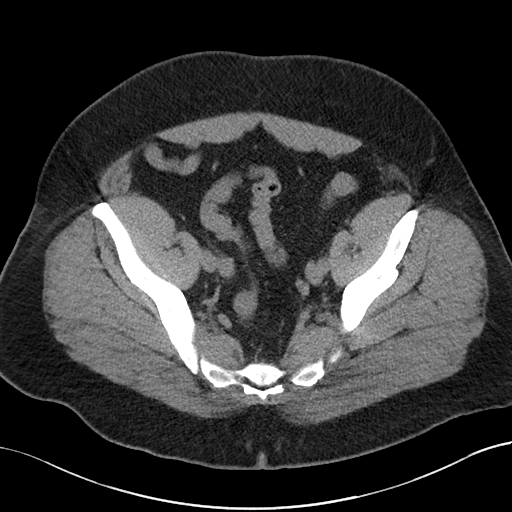
[im 36/100  soft-tissue]
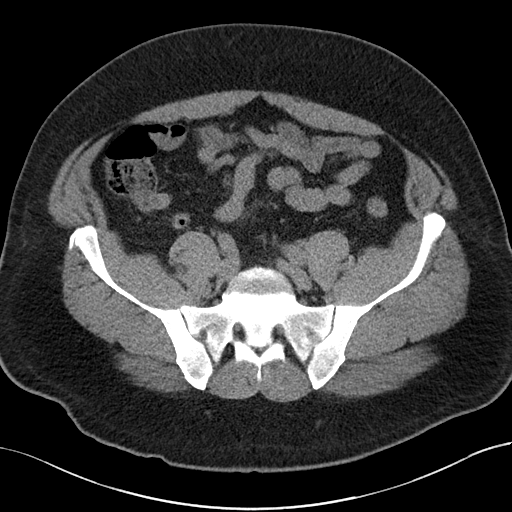
[im 44/100  soft-tissue]
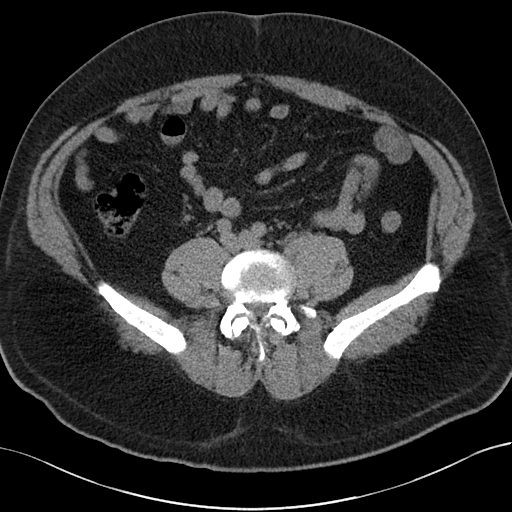
[im 52/100  soft-tissue]
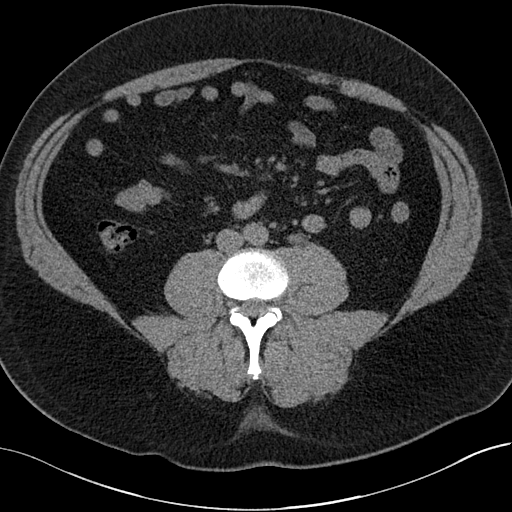
[im 56/100  soft-tissue]
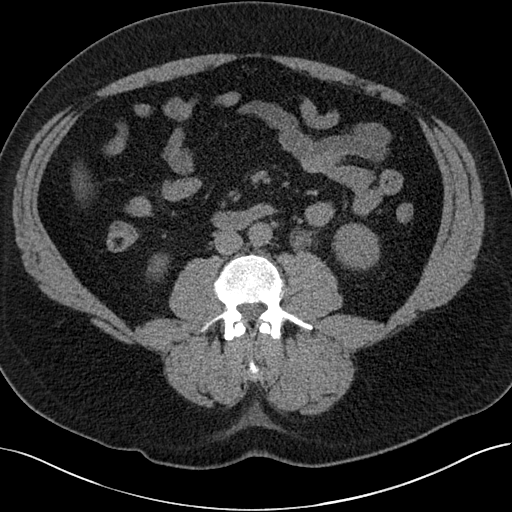
[im 64/100  soft-tissue]
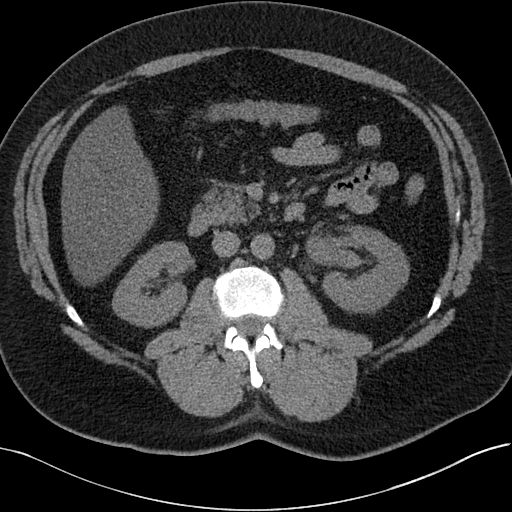
[im 64/100  bone]
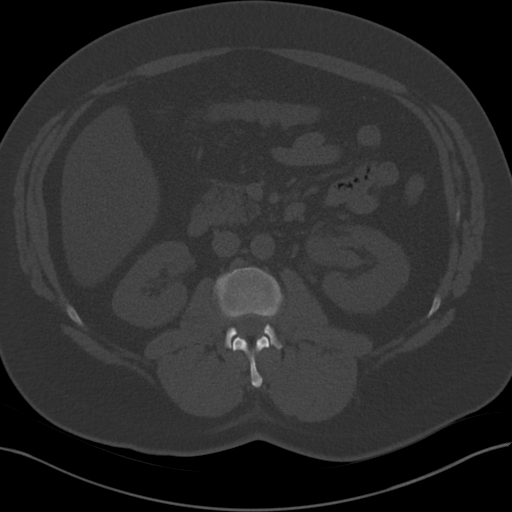
[im 72/100  soft-tissue]
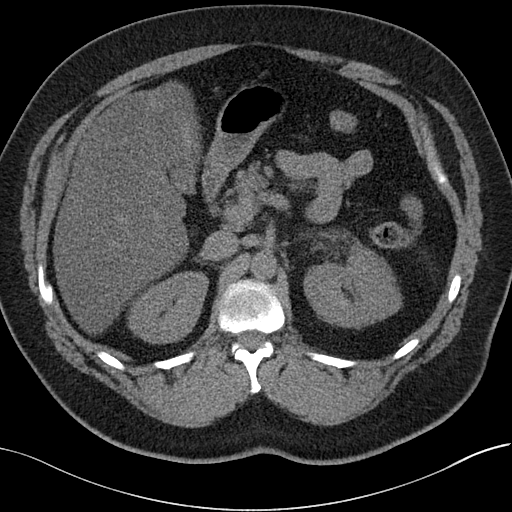
[im 80/100  soft-tissue]
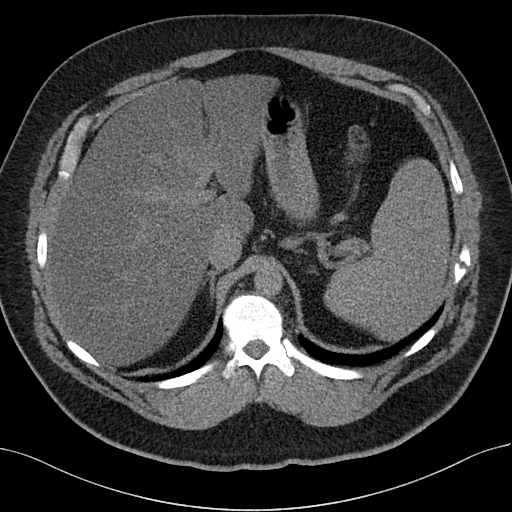
[im 88/100  soft-tissue]
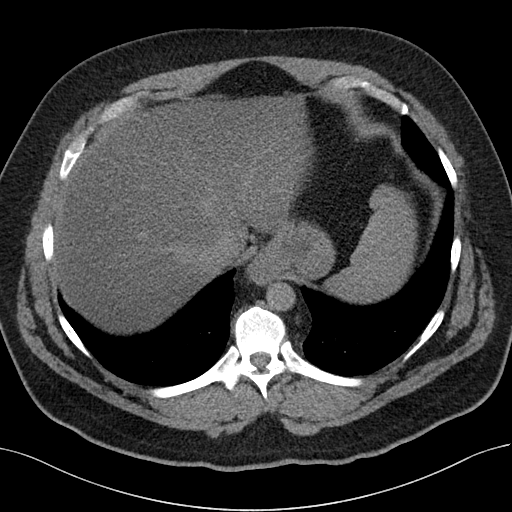
[im 96/100  soft-tissue]
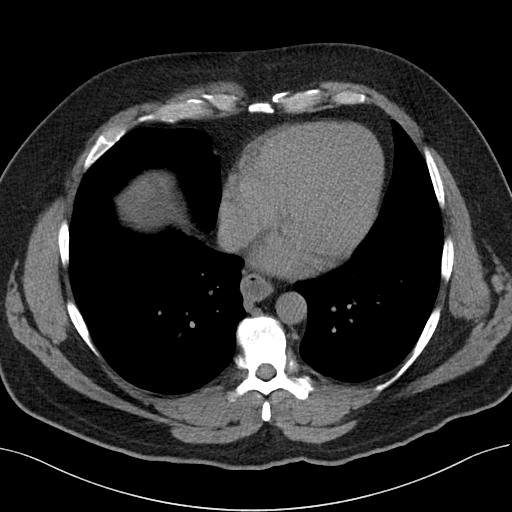

[Series 5: renal stone 3.0 cor · coronal · 0.77mm/px · 3 of 101 slices shown]
[im 34/101  soft-tissue]
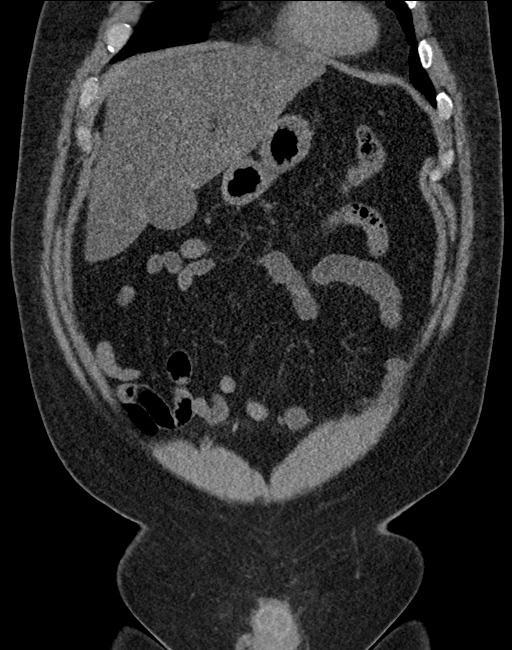
[im 45/101  soft-tissue]
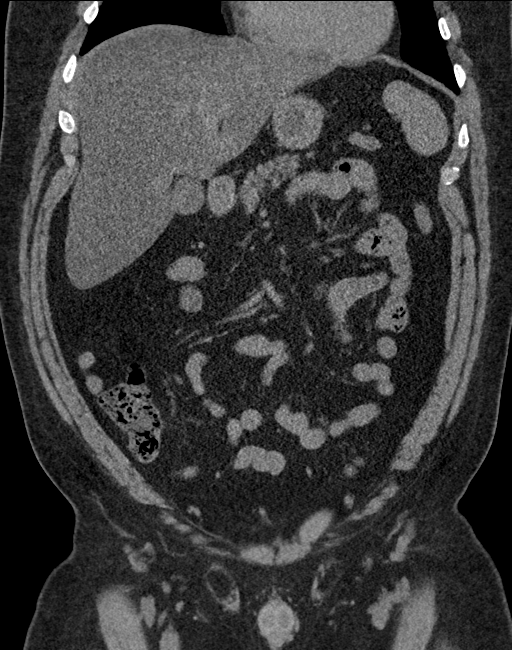
[im 56/101  soft-tissue]
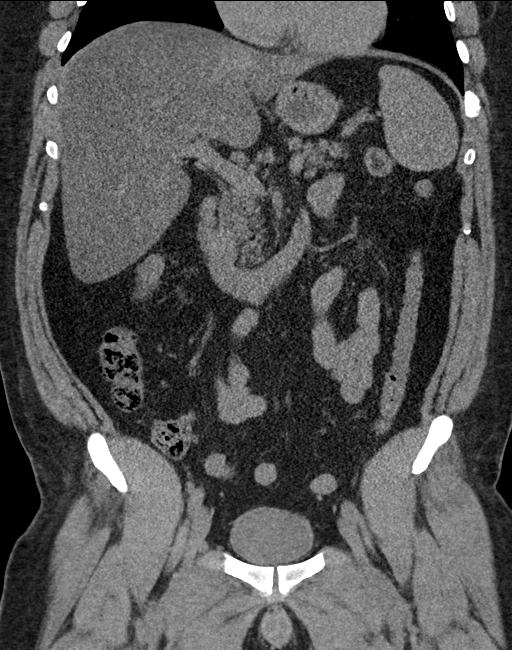

[16 of 46 positions shown; findings below may reference images not displayed]

FINDINGS: Lower chest: Clear lung bases. No pleural or pericardial effusion.
The distal esophagus is fluid filled but only minimally dilated.

Hepatobiliary: Severe hepatic steatosis. No focal lesion identified
on noncontrast imaging. No evidence of gallstones, gallbladder wall
thickening or biliary dilatation.

Pancreas: Unremarkable. No pancreatic ductal dilatation or
surrounding inflammatory changes.

Spleen: Normal in size without focal abnormality.

Adrenals/Urinary Tract: Both adrenal glands appear normal. The right
kidney appears normal. On the left, there is perinephric soft tissue
stranding and moderate hydronephrosis secondary to a 1-2 mm calculus
at the left ureterovesical junction (image 81). No other urinary
tract calculi are seen. The bladder appears unremarkable.

Stomach/Bowel: No evidence of bowel wall thickening, distention or
surrounding inflammatory change. The appendix appears normal.

Vascular/Lymphatic: There are no enlarged abdominal or pelvic lymph
nodes. No significant vascular findings on noncontrast imaging.

Reproductive: The prostate gland and seminal vesicles appear
unremarkable.

Other: No evidence of abdominal wall mass or hernia. No ascites.

Musculoskeletal: No acute or significant osseous findings. Multiple
incompletely healed rib fractures are again noted laterally on the
left, stable.
IMPRESSION: 1. Obstructing 1-2 mm calculus at the left ureterovesical junction.
Associated hydronephrosis and perinephric soft tissue stranding.
2. No other urinary tract calculi.
3. Severe hepatic steatosis.
4. Fluid-filled distal esophagus consistent with given history of
reflux.

## 2018-08-12 IMAGING — DX DG CHEST 2V
2 series · 2 of 2 positions shown · non-contrast
Comparison: Chest x-ray dated August 06, 2016.

CLINICAL DATA: Cough, wheezing, fever, and shortness of breath for
the past 6 days.

EXAM:
CHEST  2 VIEW

[chest pa]
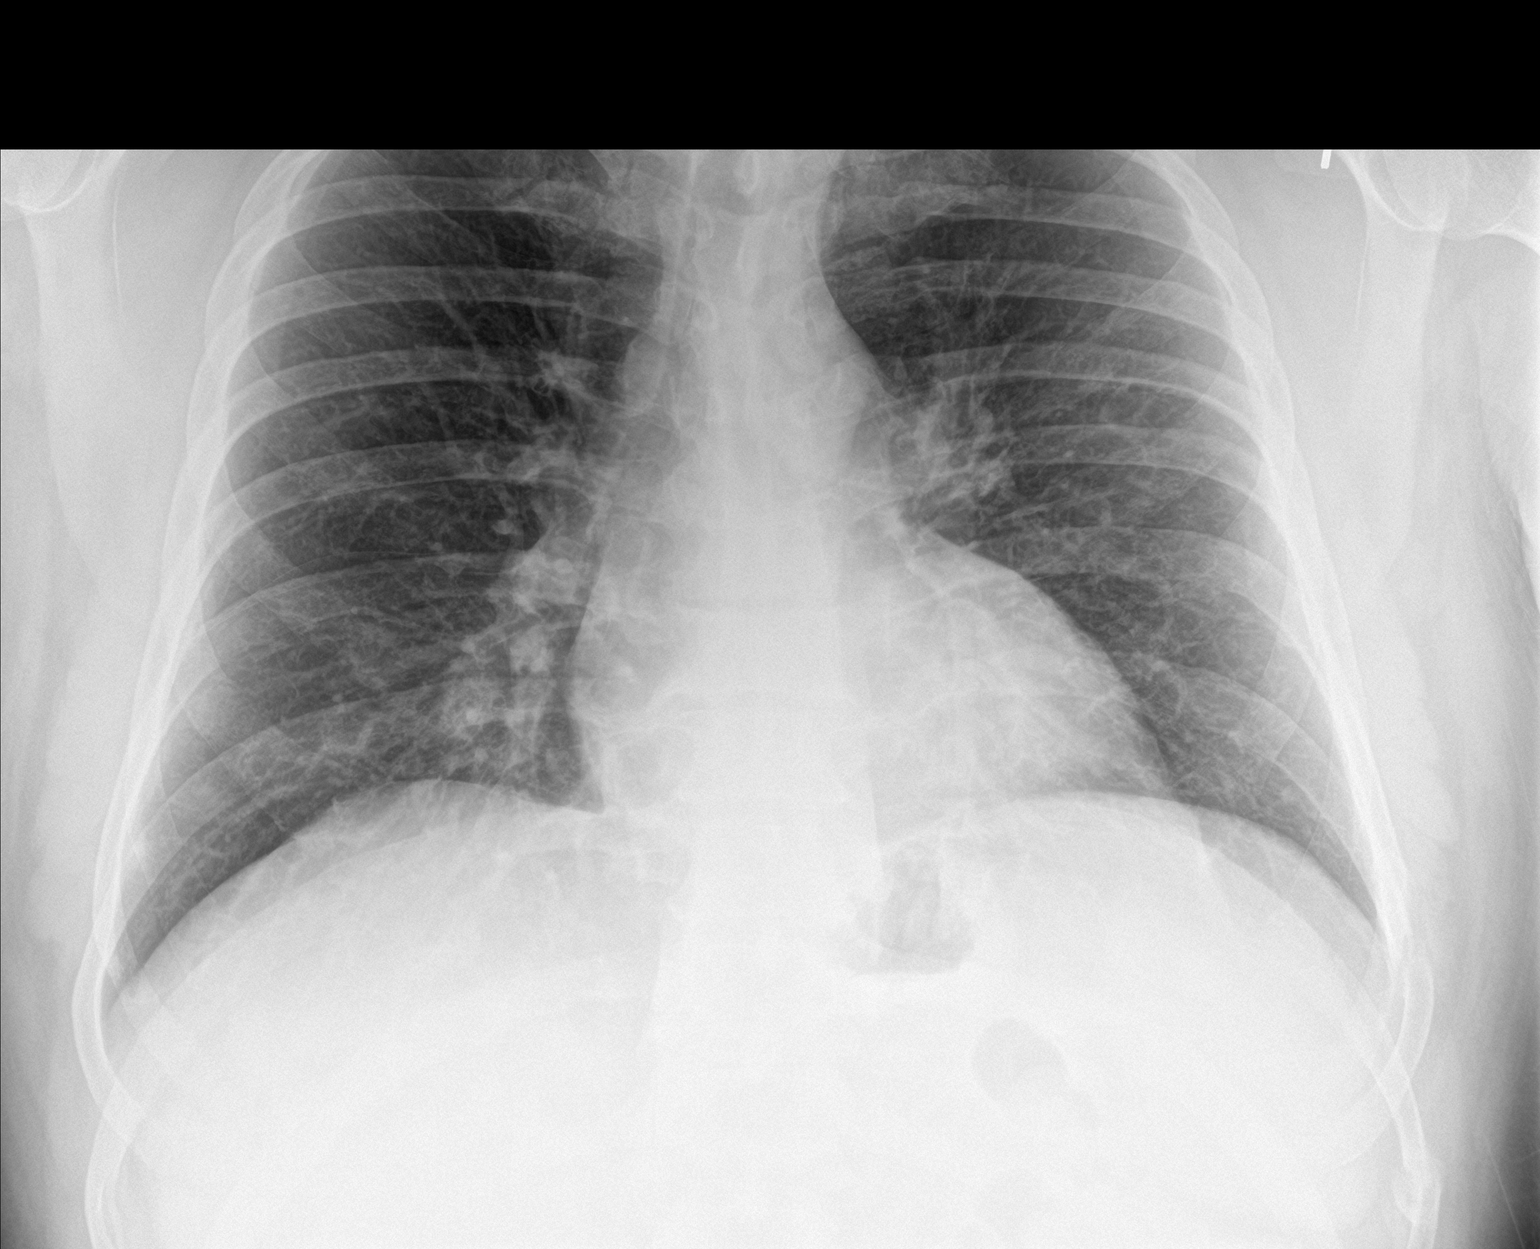

[chest lat]
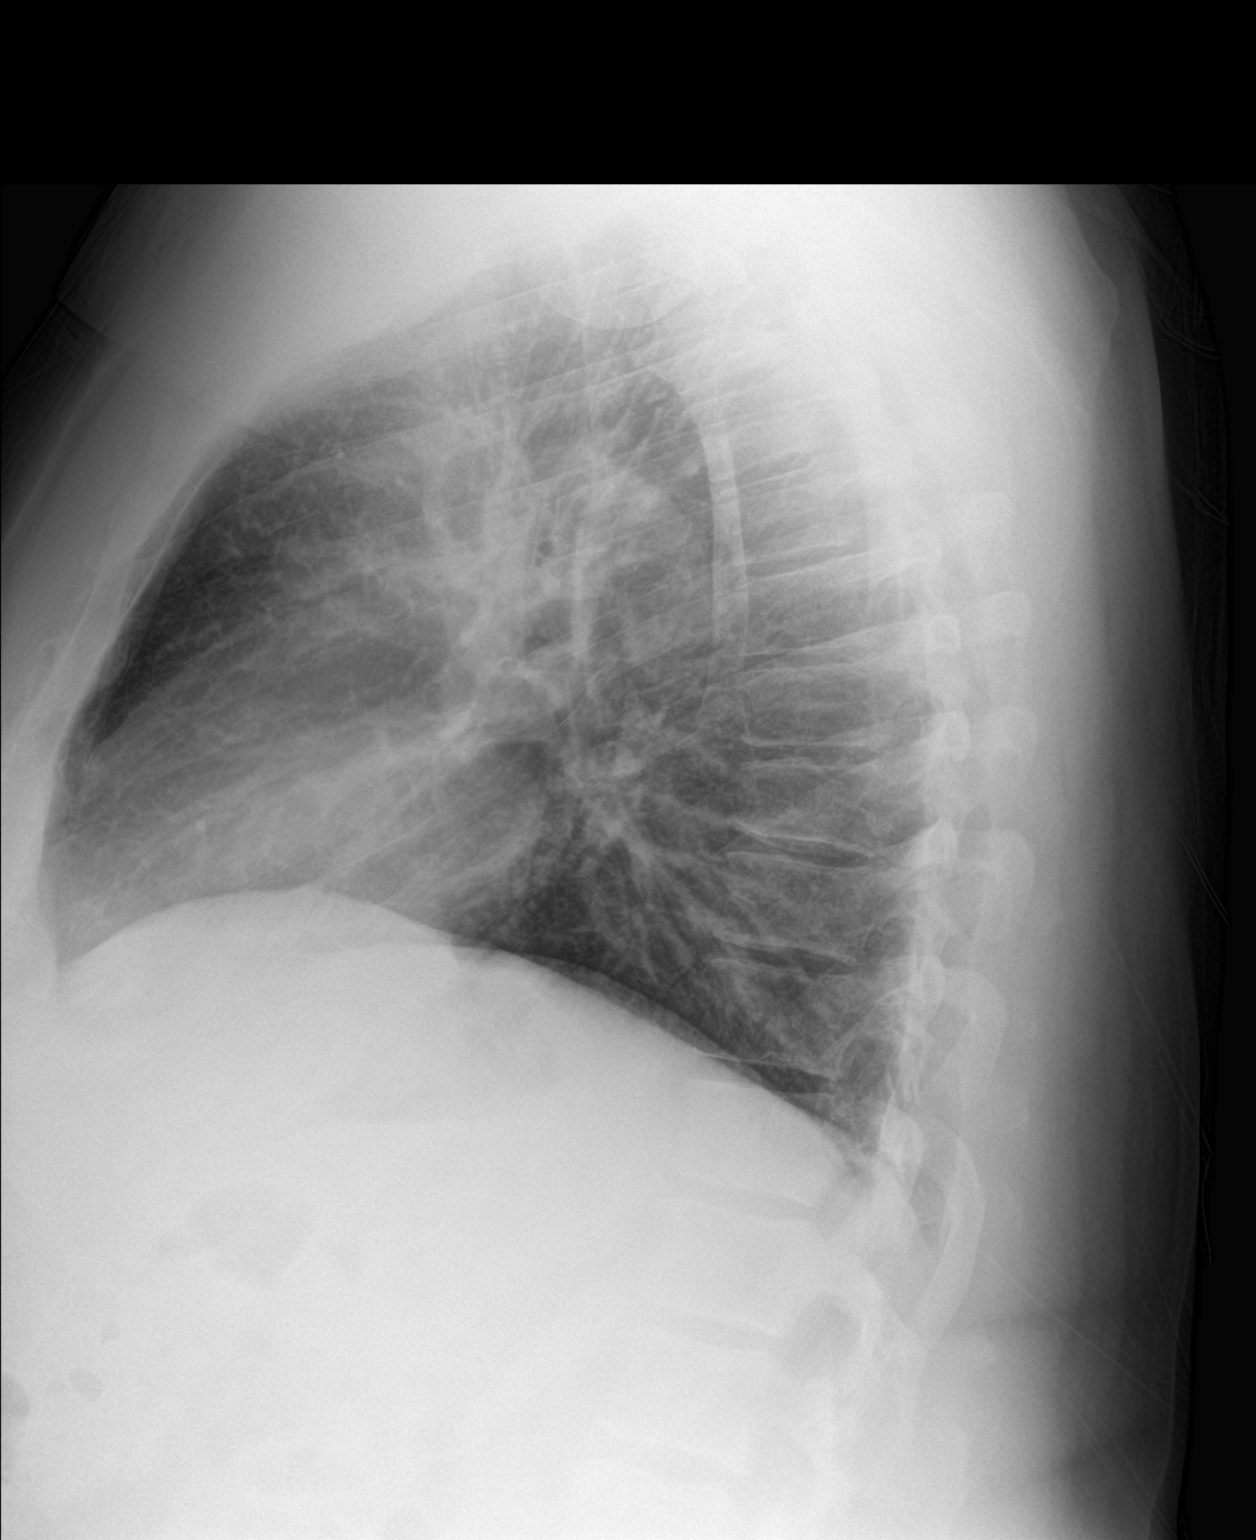

[2 of 2 positions shown; findings below may reference images not displayed]

FINDINGS: The cardiomediastinal silhouette is normal in size. Normal pulmonary
vascularity. Mild central peribronchial thickening. No focal
consolidation, pleural effusion, or pneumothorax. No acute osseous
abnormality.
IMPRESSION: Mild central peribronchial thickening, as can be seen with
bronchitis. No consolidation.

## 2018-09-18 ENCOUNTER — Other Ambulatory Visit: Payer: Self-pay | Admitting: Family Medicine

## 2018-10-20 ENCOUNTER — Encounter: Payer: Self-pay | Admitting: Family Medicine

## 2018-10-20 ENCOUNTER — Other Ambulatory Visit: Payer: Self-pay

## 2018-10-20 ENCOUNTER — Ambulatory Visit (INDEPENDENT_AMBULATORY_CARE_PROVIDER_SITE_OTHER): Payer: BC Managed Care – PPO | Admitting: Family Medicine

## 2018-10-20 DIAGNOSIS — K219 Gastro-esophageal reflux disease without esophagitis: Secondary | ICD-10-CM | POA: Diagnosis not present

## 2018-10-20 MED ORDER — OMEPRAZOLE 40 MG PO CPDR
40.0000 mg | DELAYED_RELEASE_CAPSULE | Freq: Every day | ORAL | 3 refills | Status: DC
Start: 2018-10-20 — End: 2019-10-16

## 2018-10-20 NOTE — Progress Notes (Signed)
Subjective:    Patient ID: Benjamin Cunningham, male    DOB: [birth year], 53 y.o.   MRN: [id number]  HPI Virtual Visit via Video Note  I connected with the patient on 10/20/18 at 10:45 AM EDT by a video enabled telemedicine application and verified that I am speaking with the correct person using two identifiers.  Location patient: home Location provider:work or home office Persons participating in the virtual visit: patient, provider  I discussed the limitations of evaluation and management by telemedicine and the availability of in person appointments. The patient expressed understanding and agreed to proceed.   HPI: Here to follow up on GERD. He feels well and is pleased with how the Omeprazole works for him. BMs are regular.   ROS: See pertinent positives and negatives per HPI.  Past Medical History:  Diagnosis Date   GERD (gastroesophageal reflux disease)    History of seasonal allergies     Past Surgical History:  Procedure Laterality Date   WISDOM TOOTH EXTRACTION     around early 58s    Family History  Problem Relation Age of Onset   Cervical cancer Mother    Breast cancer Maternal Grandmother    Liver cancer Maternal Grandmother    Colon cancer Neg Hx      Current Outpatient Medications:    albuterol (PROVENTIL HFA;VENTOLIN HFA) 108 (90 Base) MCG/ACT inhaler, Inhale 2 puffs into the lungs every 6 (six) hours as needed., Disp: 1 Inhaler, Rfl: 0   azithromycin (ZITHROMAX) 250 MG tablet, As directed, Disp: 6 tablet, Rfl: 0   cetirizine (ZYRTEC) 10 MG tablet, Take 10 mg by mouth daily., Disp: , Rfl:    HYDROcodone-acetaminophen (NORCO/VICODIN) 5-325 MG tablet, Take 2 tablets by mouth every 4 (four) hours as needed., Disp: 15 tablet, Rfl: 0   HYDROcodone-homatropine (HYDROMET) 5-1.5 MG/5ML syrup, Take 5 mLs by mouth every 4 (four) hours as needed., Disp: 240 mL, Rfl: 0   ibuprofen (ADVIL,MOTRIN) 200 MG tablet, Take 600 mg by mouth every 12 (twelve) hours  as needed (for pain). , Disp: , Rfl:    Multiple Vitamins-Minerals (CENTRUM SILVER 50+MEN) TABS, Take 1 tablet by mouth daily., Disp: , Rfl:    omeprazole (PRILOSEC) 40 MG capsule, Take 1 capsule (40 mg total) by mouth daily., Disp: 90 capsule, Rfl: 3   ondansetron (ZOFRAN-ODT) 4 MG disintegrating tablet, Take 1 tablet (4 mg total) by mouth every 8 (eight) hours as needed for nausea., Disp: 8 tablet, Rfl: 0   tamsulosin (FLOMAX) 0.4 MG CAPS capsule, Take 1 capsule (0.4 mg total) by mouth daily., Disp: 30 capsule, Rfl: 0  Current Facility-Administered Medications:    0.9 %  sodium chloride infusion, 500 mL, Intravenous, Continuous, Nandigam, Kavitha V, MD  EXAM:  VITALS per patient if applicable:  GENERAL: alert, oriented, appears well and in no acute distress  HEENT: atraumatic, conjunttiva clear, no obvious abnormalities on inspection of external nose and ears  NECK: normal movements of the head and neck  LUNGS: on inspection no signs of respiratory distress, breathing rate appears normal, no obvious gross SOB, gasping or wheezing  CV: no obvious cyanosis  MS: moves all visible extremities without noticeable abnormality  PSYCH/NEURO: pleasant and cooperative, no obvious depression or anxiety, speech and thought processing grossly intact  ASSESSMENT AND PLAN: GERD is stable. Omeprazole was refilled.  Alysia Penna, MD  Discussed the following assessment and plan:  No diagnosis found.     I discussed the assessment and treatment plan with the  patient. The patient was provided an opportunity to ask questions and all were answered. The patient agreed with the plan and demonstrated an understanding of the instructions.   The patient was advised to call back or seek an in-person evaluation if the symptoms worsen or if the condition fails to improve as anticipated.     Review of Systems     Objective:   Physical Exam        Assessment & Plan:

## 2019-01-30 ENCOUNTER — Other Ambulatory Visit: Payer: Self-pay

## 2019-01-30 DIAGNOSIS — Z20822 Contact with and (suspected) exposure to covid-19: Secondary | ICD-10-CM

## 2019-01-31 LAB — NOVEL CORONAVIRUS, NAA: SARS-CoV-2, NAA: NOT DETECTED

## 2019-04-06 ENCOUNTER — Ambulatory Visit: Payer: BC Managed Care – PPO | Attending: Internal Medicine

## 2019-04-06 DIAGNOSIS — Z20822 Contact with and (suspected) exposure to covid-19: Secondary | ICD-10-CM | POA: Diagnosis not present

## 2019-04-08 LAB — NOVEL CORONAVIRUS, NAA: SARS-CoV-2, NAA: NOT DETECTED

## 2019-07-14 DIAGNOSIS — R911 Solitary pulmonary nodule: Secondary | ICD-10-CM | POA: Diagnosis not present

## 2019-07-14 DIAGNOSIS — S161XXA Strain of muscle, fascia and tendon at neck level, initial encounter: Secondary | ICD-10-CM | POA: Diagnosis not present

## 2019-07-14 DIAGNOSIS — S2242XA Multiple fractures of ribs, left side, initial encounter for closed fracture: Secondary | ICD-10-CM | POA: Diagnosis not present

## 2019-07-14 DIAGNOSIS — S2220XA Unspecified fracture of sternum, initial encounter for closed fracture: Secondary | ICD-10-CM | POA: Diagnosis not present

## 2019-08-23 ENCOUNTER — Telehealth: Payer: Self-pay | Admitting: Family Medicine

## 2019-08-23 NOTE — Telephone Encounter (Signed)
Spoke with the patient. Patient was made aware that Dr. Sarajane Jews is out of the office this week. Appointment with another provider was offered and declined. Appointment scheduled for 6/2 with Dr. Sarajane Jews.

## 2019-08-23 NOTE — Telephone Encounter (Signed)
No triage done by [facility]. As the wife declined triage per Benjamin Cunningham. Wife reports  her husband fell 6 weeks ago and broke his sternum and 6 ribs. He is still having a lot of pain. It is hurting to take a deep breath, sneeze, cough, and bending in certain ways. She would like to know where he should be seen to make sure he is healing properly

## 2019-08-23 NOTE — Telephone Encounter (Signed)
Transferred pt's wife, Mardene Celeste, to Nurse Triage because she said, he is having difficulty breathing and every time he cough or sneezes he has terrible abdominal pain.

## 2019-08-29 ENCOUNTER — Other Ambulatory Visit: Payer: Self-pay

## 2019-08-30 ENCOUNTER — Encounter: Payer: Self-pay | Admitting: Family Medicine

## 2019-08-30 ENCOUNTER — Ambulatory Visit (INDEPENDENT_AMBULATORY_CARE_PROVIDER_SITE_OTHER): Payer: BC Managed Care – PPO | Admitting: Family Medicine

## 2019-08-30 VITALS — BP 140/70 | HR 94 | Temp 98.3°F | Wt 280.2 lb

## 2019-08-30 DIAGNOSIS — S2222XG Fracture of body of sternum, subsequent encounter for fracture with delayed healing: Secondary | ICD-10-CM | POA: Diagnosis not present

## 2019-08-30 MED ORDER — DICLOFENAC SODIUM 75 MG PO TBEC: 75.0000 mg | DELAYED_RELEASE_TABLET | Freq: Two times a day (BID) | ORAL | 2 refills | Status: DC

## 2019-08-30 NOTE — Progress Notes (Signed)
   Subjective:    Patient ID: Benjamin Cunningham, male    DOB: May 23, 1965, 54 y.o.   MRN: [id number]  HPI Here to follow up on an ER visit on 07-14-19 after he fell 6 foot off the top of a storage container. He was attempting to cut the roof off this container and he lost his balance. He landed on his right shoulder. Xrays revealed a fractured sternum. He was given a small supply of Percocet, and he has been taking 800 mg Ibuprofen since then. He still has sharp pains when he makes certain movements or if he coughs or sneezes. No SOB.    Review of Systems  Constitutional: Negative.   Respiratory: Negative.   Cardiovascular: Positive for chest pain. Negative for palpitations and leg swelling.       Objective:   Physical Exam Constitutional:      Appearance: Normal appearance. He is not ill-appearing.  Cardiovascular:     Rate and Rhythm: Normal rate and regular rhythm.     Pulses: Normal pulses.     Heart sounds: Normal heart sounds.  Pulmonary:     Effort: Pulmonary effort is normal.     Breath sounds: Normal breath sounds.     Comments: He is mildly tender over the sternum. No crepitus  Neurological:     Mental Status: He is alert.           Assessment & Plan:  Sternal fracture. I told him this may take another 4-6 weeks to heal completely. Try Diclofenac BID for the pain. Recheck prn.  Alysia Penna, MD

## 2019-10-16 ENCOUNTER — Other Ambulatory Visit: Payer: Self-pay | Admitting: Family Medicine

## 2020-09-16 ENCOUNTER — Other Ambulatory Visit: Payer: Self-pay | Admitting: Family Medicine

## 2020-10-02 ENCOUNTER — Telehealth (INDEPENDENT_AMBULATORY_CARE_PROVIDER_SITE_OTHER): Payer: BC Managed Care – PPO | Admitting: Adult Health

## 2020-10-02 ENCOUNTER — Encounter: Payer: Self-pay | Admitting: Adult Health

## 2020-10-02 VITALS — BP 143/91 | HR 63 | Ht 68.0 in | Wt 280.0 lb

## 2020-10-02 DIAGNOSIS — U071 COVID-19: Secondary | ICD-10-CM

## 2020-10-02 MED ORDER — MOLNUPIRAVIR EUA 200MG CAPSULE: 4.0000 | ORAL_CAPSULE | Freq: Two times a day (BID) | ORAL | 0 refills | Status: AC

## 2020-10-02 MED ORDER — BENZONATATE 200 MG PO CAPS: 200.0000 mg | ORAL_CAPSULE | Freq: Three times a day (TID) | ORAL | 1 refills | Status: DC | PRN

## 2020-10-02 NOTE — Progress Notes (Signed)
Virtual Visit via Video Note  I connected with Benjamin Cunningham on 10/02/20 at 11:00 AM EDT by a video enabled telemedicine application and verified that I am speaking with the correct person using two identifiers.  Location patient: home Location provider:work or home office Persons participating in the virtual visit: patient, provider  I discussed the limitations of evaluation and management by telemedicine and the availability of in person appointments. The patient expressed understanding and agreed to proceed.   HPI: 55 year old male who  has a past medical history of GERD (gastroesophageal reflux disease) and History of seasonal allergies.  Is being evaluated today for positive COVID-19 infection.  His symptoms started roughly 2 days ago.  He tested positive via PCR test.  His symptoms include diaphoresis, nasal congestion, extreme fatigue, generalized body aches, headache, fevers, chills, and cough.  He has been fully vaccinated against COVID-19.   ROS: See pertinent positives and negatives per HPI.  Past Medical History:  Diagnosis Date   GERD (gastroesophageal reflux disease)    History of seasonal allergies     Past Surgical History:  Procedure Laterality Date   WISDOM TOOTH EXTRACTION     around early 57s    Family History  Problem Relation Age of Onset   Cervical cancer Mother    Breast cancer Maternal Grandmother    Liver cancer Maternal Grandmother    Colon cancer Neg Hx        Current Outpatient Medications:    cetirizine (ZYRTEC) 10 MG tablet, Take 10 mg by mouth daily., Disp: , Rfl:    Multiple Vitamins-Minerals (CENTRUM SILVER 50+MEN) TABS, Take 1 tablet by mouth daily., Disp: , Rfl:    omeprazole (PRILOSEC) 40 MG capsule, Take by mouth., Disp: , Rfl:    albuterol (PROVENTIL HFA;VENTOLIN HFA) 108 (90 Base) MCG/ACT inhaler, Inhale 2 puffs into the lungs every 6 (six) hours as needed., Disp: 1 Inhaler, Rfl: 0   azithromycin (ZITHROMAX) 250 MG tablet, As  directed, Disp: 6 tablet, Rfl: 0   diclofenac (VOLTAREN) 75 MG EC tablet, Take 1 tablet (75 mg total) by mouth 2 (two) times daily., Disp: 60 tablet, Rfl: 2   HYDROcodone-acetaminophen (NORCO/VICODIN) 5-325 MG tablet, Take 2 tablets by mouth every 4 (four) hours as needed., Disp: 15 tablet, Rfl: 0   HYDROcodone-homatropine (HYDROMET) 5-1.5 MG/5ML syrup, Take 5 mLs by mouth every 4 (four) hours as needed., Disp: 240 mL, Rfl: 0   ibuprofen (ADVIL,MOTRIN) 200 MG tablet, Take 600 mg by mouth every 12 (twelve) hours as needed (for pain). , Disp: , Rfl:    omeprazole (PRILOSEC) 40 MG capsule, TAKE 1 CAPSULE(40 MG) BY MOUTH DAILY, Disp: 90 capsule, Rfl: 3   ondansetron (ZOFRAN-ODT) 4 MG disintegrating tablet, Take 1 tablet (4 mg total) by mouth every 8 (eight) hours as needed for nausea., Disp: 8 tablet, Rfl: 0   tamsulosin (FLOMAX) 0.4 MG CAPS capsule, Take 1 capsule (0.4 mg total) by mouth daily., Disp: 30 capsule, Rfl: 0  Current Facility-Administered Medications:    0.9 %  sodium chloride infusion, 500 mL, Intravenous, Continuous, Nandigam, Kavitha V, MD  EXAM:  VITALS per patient if applicable:  GENERAL: alert, oriented, appears well and in no acute distress  HEENT: atraumatic, conjunttiva clear, no obvious abnormalities on inspection of external nose and ears  NECK: normal movements of the head and neck  LUNGS: on inspection no signs of respiratory distress, breathing rate appears normal, no obvious gross SOB, gasping or wheezing  CV: no obvious cyanosis  MS: moves all visible extremities without noticeable abnormality  PSYCH/NEURO: pleasant and cooperative, no obvious depression or anxiety, speech and thought processing grossly intact  ASSESSMENT AND PLAN:  Discussed the following assessment and plan:  1. COVID-19 virus infection -Discussed treatment options, he would qualify for antiviral treatment.  Has not had any blood work done recently so we will refrain from [place]  follow-up if no improvement in the next 36 hours or sooner if symptoms worsen - molnupiravir EUA 200 mg CAPS; Take 4 capsules (800 mg total) by mouth 2 (two) times daily for 5 days.  Dispense: 40 capsule; Refill: 0 - benzonatate (TESSALON) 200 MG capsule; Take 1 capsule (200 mg total) by mouth 3 (three) times daily as needed for cough.  Dispense: 30 capsule; Refill: 1      I discussed the assessment and treatment plan with the patient. The patient was provided an opportunity to ask questions and all were answered. The patient agreed with the plan and demonstrated an understanding of the instructions.   The patient was advised to call back or seek an in-person evaluation if the symptoms worsen or if the condition fails to improve as anticipated.   Benjamin Peng, NP

## 2020-10-02 NOTE — Progress Notes (Deleted)
   Subjective:    Patient ID: Benjamin Cunningham, male    DOB: 05/31/1965, 55 y.o.   MRN: [id number]  HPI    Review of Systems     Objective:   Physical Exam        Assessment & Plan:

## 2021-05-01 ENCOUNTER — Encounter: Payer: Self-pay | Admitting: Family Medicine

## 2021-05-01 ENCOUNTER — Ambulatory Visit (INDEPENDENT_AMBULATORY_CARE_PROVIDER_SITE_OTHER): Payer: BC Managed Care – PPO | Admitting: Family Medicine

## 2021-05-01 VITALS — BP 130/86 | HR 78 | Temp 98.0°F | Ht 68.0 in | Wt 295.0 lb

## 2021-05-01 DIAGNOSIS — Z0000 Encounter for general adult medical examination without abnormal findings: Secondary | ICD-10-CM | POA: Diagnosis not present

## 2021-05-01 DIAGNOSIS — Z125 Encounter for screening for malignant neoplasm of prostate: Secondary | ICD-10-CM

## 2021-05-01 DIAGNOSIS — Z86010 Personal history of colonic polyps: Secondary | ICD-10-CM | POA: Diagnosis not present

## 2021-05-01 DIAGNOSIS — M7989 Other specified soft tissue disorders: Secondary | ICD-10-CM | POA: Diagnosis not present

## 2021-05-01 LAB — CBC WITH DIFFERENTIAL/PLATELET
Basophils Absolute: 0 10*3/uL (ref 0.0–0.1)
Basophils Relative: 0.7 % (ref 0.0–3.0)
Eosinophils Absolute: 0.2 10*3/uL (ref 0.0–0.7)
Eosinophils Relative: 4.3 % (ref 0.0–5.0)
HCT: 44.8 % (ref 39.0–52.0)
Hemoglobin: 15.2 g/dL (ref 13.0–17.0)
Lymphocytes Relative: 29 % (ref 12.0–46.0)
Lymphs Abs: 1.6 10*3/uL (ref 0.7–4.0)
MCHC: 34 g/dL (ref 30.0–36.0)
MCV: 86.4 fl (ref 78.0–100.0)
Monocytes Absolute: 0.4 10*3/uL (ref 0.1–1.0)
Monocytes Relative: 7.1 % (ref 3.0–12.0)
Neutro Abs: 3.3 10*3/uL (ref 1.4–7.7)
Neutrophils Relative %: 58.9 % (ref 43.0–77.0)
Platelets: 264 10*3/uL (ref 150.0–400.0)
RBC: 5.18 Mil/uL (ref 4.22–5.81)
RDW: 13.9 % (ref 11.5–15.5)
WBC: 5.7 10*3/uL (ref 4.0–10.5)

## 2021-05-01 LAB — LIPID PANEL
Cholesterol: 200 mg/dL (ref 0–200)
HDL: 65 mg/dL (ref >39.00)
LDL Cholesterol: 120 mg/dL — ABNORMAL HIGH (ref 0–99)
NonHDL: 135.28
Total CHOL/HDL Ratio: 3
Triglycerides: 77 mg/dL (ref 0.0–149.0)
VLDL: 15.4 mg/dL (ref 0.0–40.0)

## 2021-05-01 LAB — PSA: PSA: 0.46 ng/mL (ref 0.10–4.00)

## 2021-05-01 LAB — HEPATIC FUNCTION PANEL
ALT: 48 U/L (ref 0–53)
AST: 31 U/L (ref 0–37)
Albumin: 4.6 g/dL (ref 3.5–5.2)
Alkaline Phosphatase: 87 U/L (ref 39–117)
Bilirubin, Direct: 0.1 mg/dL (ref 0.0–0.3)
Total Bilirubin: 0.7 mg/dL (ref 0.2–1.2)
Total Protein: 7.6 g/dL (ref 6.0–8.3)

## 2021-05-01 LAB — BASIC METABOLIC PANEL
BUN: 18 mg/dL (ref 6–23)
CO2: 29 mEq/L (ref 19–32)
Calcium: 10 mg/dL (ref 8.4–10.5)
Chloride: 102 mEq/L (ref 96–112)
Creatinine, Ser: 1.01 mg/dL (ref 0.40–1.50)
GFR: 83.12 mL/min (ref >60.00)
Glucose, Bld: 120 mg/dL — ABNORMAL HIGH (ref 70–99)
Potassium: 4.6 mEq/L (ref 3.5–5.1)
Sodium: 140 mEq/L (ref 135–145)

## 2021-05-01 LAB — HEMOGLOBIN A1C: Hgb A1c MFr Bld: 6.1 % (ref 4.6–6.5)

## 2021-05-01 LAB — TSH: TSH: 1.22 u[IU]/mL (ref 0.35–5.50)

## 2021-05-01 MED ORDER — PHENTERMINE HCL 37.5 MG PO CAPS: 37.5000 mg | ORAL_CAPSULE | ORAL | 1 refills | Status: DC

## 2021-05-01 MED ORDER — OMEPRAZOLE 20 MG PO CPDR: 20.0000 mg | DELAYED_RELEASE_CAPSULE | Freq: Every day | ORAL | 3 refills | Status: AC | PRN

## 2021-05-01 NOTE — Progress Notes (Signed)
Subjective:    Patient ID: Benjamin Cunningham, male    DOB: 06/24/1965, 56 y.o.   MRN: [id number]  HPI Here for a well exam. He feels fine. He does mention that his right calf has been swollen ever since he fell and fractured his right fibula in May of 2018. This is not painful and does not bother him at all. The foot and ankle do not swell. He also asks for help with losing weight. He eats a fairly healthy diet, since he eats the same foods as his wife who has Crohn's disease. He is active on his job, but he admits to getting no other exercise.    Review of Systems  Constitutional: Negative.   HENT: Negative.    Eyes: Negative.   Respiratory: Negative.    Cardiovascular:  Positive for leg swelling. Negative for chest pain and palpitations.  Gastrointestinal: Negative.   Genitourinary: Negative.   Musculoskeletal: Negative.   Skin: Negative.   Neurological: Negative.   Psychiatric/Behavioral: Negative.        Objective:   Physical Exam Constitutional:      General: He is not in acute distress.    Appearance: He is well-developed. He is obese. He is not diaphoretic.  HENT:     Head: Normocephalic and atraumatic.     Right Ear: External ear normal.     Left Ear: External ear normal.     Nose: Nose normal.     Mouth/Throat:     Pharynx: No oropharyngeal exudate.  Eyes:     General: No scleral icterus.       Right eye: No discharge.        Left eye: No discharge.     Conjunctiva/sclera: Conjunctivae normal.     Pupils: Pupils are equal, round, and reactive to light.  Neck:     Thyroid: No thyromegaly.     Vascular: No JVD.     Trachea: No tracheal deviation.  Cardiovascular:     Rate and Rhythm: Normal rate and regular rhythm.     Heart sounds: Normal heart sounds. No murmur heard.   No friction rub. No gallop.  Pulmonary:     Effort: Pulmonary effort is normal. No respiratory distress.     Breath sounds: Normal breath sounds. No wheezing or rales.  Chest:     Chest  wall: No tenderness.  Abdominal:     General: Bowel sounds are normal. There is no distension.     Palpations: Abdomen is soft. There is no mass.     Tenderness: There is no abdominal tenderness. There is no guarding or rebound.  Genitourinary:    Penis: Normal. No tenderness.      Testes: Normal.     Prostate: Normal.     Rectum: Normal. Guaiac result negative.  Musculoskeletal:        General: No tenderness. Normal range of motion.     Cervical back: Neck supple.     Comments: The right calf is slightly larger than the left, but there is no tenderness. No cords felt and negative Homan's test   Lymphadenopathy:     Cervical: No cervical adenopathy.  Skin:    General: Skin is warm and dry.     Coloration: Skin is not pale.     Findings: No erythema or rash.  Neurological:     Mental Status: He is alert and oriented to person, place, and time.     Cranial Nerves: No cranial nerve deficit.  Motor: No abnormal muscle tone.     Coordination: Coordination normal.     Deep Tendon Reflexes: Reflexes are normal and symmetric. Reflexes normal.  Psychiatric:        Behavior: Behavior normal.        Thought Content: Thought content normal.        Judgment: Judgment normal.          Assessment & Plan:  Well exam. We discussed diet and exercise. Get fasting labs. He will try Phentermine every morning to help with weight loss. We will evaluate the right leg with a venous doppler. Refer for another colonoscopy.  Alysia Penna, MD

## 2021-05-05 ENCOUNTER — Ambulatory Visit (HOSPITAL_COMMUNITY)
Admission: RE | Admit: 2021-05-05 | Discharge: 2021-05-05 | Disposition: A | Discharge status: Home / Self Care | Payer: BC Managed Care – PPO | Source: Ambulatory Visit | Attending: Cardiology | Admitting: Cardiology

## 2021-05-05 ENCOUNTER — Other Ambulatory Visit: Payer: Self-pay

## 2021-05-05 DIAGNOSIS — M7989 Other specified soft tissue disorders: Secondary | ICD-10-CM | POA: Diagnosis not present

## 2021-07-09 ENCOUNTER — Encounter: Payer: Self-pay | Admitting: Gastroenterology

## 2022-03-18 DIAGNOSIS — M79641 Pain in right hand: Secondary | ICD-10-CM | POA: Diagnosis not present

## 2022-03-26 ENCOUNTER — Ambulatory Visit (INDEPENDENT_AMBULATORY_CARE_PROVIDER_SITE_OTHER): Payer: BC Managed Care – PPO | Admitting: Family Medicine

## 2022-03-26 ENCOUNTER — Ambulatory Visit: Payer: BC Managed Care – PPO | Admitting: Family Medicine

## 2022-03-26 VITALS — BP 124/78 | HR 65 | Temp 98.3°F | Wt 280.0 lb

## 2022-03-26 DIAGNOSIS — K529 Noninfective gastroenteritis and colitis, unspecified: Secondary | ICD-10-CM | POA: Diagnosis not present

## 2022-03-26 MED ORDER — METRONIDAZOLE 500 MG PO TABS: 500.0000 mg | ORAL_TABLET | Freq: Three times a day (TID) | ORAL | 0 refills | Status: DC

## 2022-03-26 MED ORDER — CIPROFLOXACIN HCL 500 MG PO TABS: 500.0000 mg | ORAL_TABLET | Freq: Two times a day (BID) | ORAL | 0 refills | Status: AC

## 2022-03-26 NOTE — Progress Notes (Signed)
   Subjective:    Patient ID: Benjamin Cunningham, male    DOB: 1965-05-30, 56 y.o.   MRN: [id number]  HPI Here complaining of abdominal cramps, bloating, and diarrhea since he returned from a trip to [place]. On 03-13-22 he flew from [place] to [place]. He then had a meal at the [place] airport and flew home. The symptoms began a few hours later. He denies any nausea or vomiting. No fever. He notes that his travelling companion has had the same symptoms, and he was recently seen by a provider. Cultures of his stool came back positive for enteropathogenic E coli, enterotoxigenic E coli, enteroaggregative E coli, and Shigella.    Review of Systems  Constitutional: Negative.   Respiratory: Negative.    Cardiovascular: Negative.   Gastrointestinal:  Positive for abdominal distention, abdominal pain and diarrhea. Negative for blood in stool, constipation, nausea and vomiting.  Genitourinary: Negative.        Objective:   Physical Exam Constitutional:      Appearance: Normal appearance.  Cardiovascular:     Rate and Rhythm: Normal rate and regular rhythm.     Pulses: Normal pulses.     Heart sounds: Normal heart sounds.  Pulmonary:     Effort: Pulmonary effort is normal.     Breath sounds: Normal breath sounds.  Abdominal:     General: Abdomen is flat. Bowel sounds are normal. There is no distension.     Palpations: Abdomen is soft. There is no mass.     Tenderness: There is no guarding or rebound.     Hernia: No hernia is present.     Comments: He is moderately tender in the central abdomen  Neurological:     Mental Status: He is alert.           Assessment & Plan:  Diarrhea, likely bacterial in origin. We will treat with 10 days of Cipro and Metronidazole.  Alysia Penna, MD

## 2022-04-16 ENCOUNTER — Ambulatory Visit (INDEPENDENT_AMBULATORY_CARE_PROVIDER_SITE_OTHER): Payer: BC Managed Care – PPO | Admitting: Family Medicine

## 2022-04-16 ENCOUNTER — Encounter: Payer: Self-pay | Admitting: Family Medicine

## 2022-04-16 VITALS — BP 128/78 | HR 74 | Temp 98.2°F | Wt 282.0 lb

## 2022-04-16 DIAGNOSIS — J40 Bronchitis, not specified as acute or chronic: Secondary | ICD-10-CM

## 2022-04-16 MED ORDER — HYDROCODONE BIT-HOMATROP MBR 5-1.5 MG/5ML PO SOLN: 5.0000 mL | ORAL | 0 refills | Status: AC | PRN

## 2022-04-16 MED ORDER — AZITHROMYCIN 250 MG PO TABS: ORAL_TABLET | ORAL | 0 refills | Status: DC

## 2022-04-16 MED ORDER — METHYLPREDNISOLONE 4 MG PO TBPK: ORAL_TABLET | ORAL | 0 refills | Status: DC

## 2022-04-16 NOTE — Progress Notes (Signed)
   Subjective:    Patient ID: Benjamin Cunningham, male    DOB: March 05, 1966, 57 y.o.   MRN: [id number]  HPI Here for one week of chest congestion, coughing up yellow sputum, wheezing and mild SOB. No fever or chest pain. He has tried some Benzonatate pills he had but these have not helped.    Review of Systems  Constitutional: Negative.   HENT:  Positive for congestion. Negative for ear pain, postnasal drip, sinus pressure and sore throat.   Eyes: Negative.   Respiratory:  Positive for cough, chest tightness, shortness of breath and wheezing.   Cardiovascular: Negative.        Objective:   Physical Exam Constitutional:      Appearance: Normal appearance.  HENT:     Right Ear: Tympanic membrane, ear canal and external ear normal.     Left Ear: Tympanic membrane, ear canal and external ear normal.     Nose: Nose normal.     Mouth/Throat:     Pharynx: Oropharynx is clear.  Eyes:     Conjunctiva/sclera: Conjunctivae normal.  Cardiovascular:     Rate and Rhythm: Regular rhythm.     Pulses: Normal pulses.     Heart sounds: Normal heart sounds.  Pulmonary:     Effort: Pulmonary effort is normal.     Breath sounds: Wheezing and rhonchi present. No rales.  Lymphadenopathy:     Cervical: No cervical adenopathy.  Neurological:     Mental Status: He is alert.           Assessment & Plan:  Bronchitis, treat with a Zpack and a Medrol dose pack. Alysia Penna, MD

## 2022-07-26 ENCOUNTER — Emergency Department (HOSPITAL_BASED_OUTPATIENT_CLINIC_OR_DEPARTMENT_OTHER): Payer: BC Managed Care – PPO

## 2022-07-26 ENCOUNTER — Encounter (HOSPITAL_BASED_OUTPATIENT_CLINIC_OR_DEPARTMENT_OTHER): Payer: Self-pay | Admitting: Emergency Medicine

## 2022-07-26 ENCOUNTER — Other Ambulatory Visit: Payer: Self-pay

## 2022-07-26 ENCOUNTER — Emergency Department (HOSPITAL_BASED_OUTPATIENT_CLINIC_OR_DEPARTMENT_OTHER)
Admission: EM | Admit: 2022-07-26 | Discharge: 2022-07-26 | Disposition: A | Discharge status: Home / Self Care | Payer: BC Managed Care – PPO | Attending: Emergency Medicine | Admitting: Emergency Medicine

## 2022-07-26 DIAGNOSIS — K8020 Calculus of gallbladder without cholecystitis without obstruction: Secondary | ICD-10-CM | POA: Diagnosis not present

## 2022-07-26 DIAGNOSIS — N201 Calculus of ureter: Secondary | ICD-10-CM | POA: Diagnosis not present

## 2022-07-26 DIAGNOSIS — R109 Unspecified abdominal pain: Secondary | ICD-10-CM | POA: Diagnosis not present

## 2022-07-26 DIAGNOSIS — K760 Fatty (change of) liver, not elsewhere classified: Secondary | ICD-10-CM | POA: Diagnosis not present

## 2022-07-26 LAB — COMPREHENSIVE METABOLIC PANEL
ALT: 27 U/L (ref 0–44)
AST: 26 U/L (ref 15–41)
Albumin: 4.3 g/dL (ref 3.5–5.0)
Alkaline Phosphatase: 77 U/L (ref 38–126)
Anion gap: 11 (ref 5–15)
BUN: 16 mg/dL (ref 6–20)
CO2: 25 mmol/L (ref 22–32)
Calcium: 9.9 mg/dL (ref 8.9–10.3)
Chloride: 103 mmol/L (ref 98–111)
Creatinine, Ser: 1.17 mg/dL (ref 0.61–1.24)
GFR, Estimated: >60 mL/min (ref >60)
Glucose, Bld: 133 mg/dL — ABNORMAL HIGH (ref 70–99)
Potassium: 4.2 mmol/L (ref 3.5–5.1)
Sodium: 139 mmol/L (ref 135–145)
Total Bilirubin: 0.7 mg/dL (ref 0.3–1.2)
Total Protein: 7.4 g/dL (ref 6.5–8.1)

## 2022-07-26 LAB — CBC WITH DIFFERENTIAL/PLATELET
Abs Immature Granulocytes: 0.04 10*3/uL (ref 0.00–0.07)
Basophils Absolute: 0.1 10*3/uL (ref 0.0–0.1)
Basophils Relative: 1 %
Eosinophils Absolute: 0 10*3/uL (ref 0.0–0.5)
Eosinophils Relative: 0 %
HCT: 45.3 % (ref 39.0–52.0)
Hemoglobin: 15.6 g/dL (ref 13.0–17.0)
Immature Granulocytes: 0 %
Lymphocytes Relative: 13 %
Lymphs Abs: 1.4 10*3/uL (ref 0.7–4.0)
MCH: 28.6 pg (ref 26.0–34.0)
MCHC: 34.4 g/dL (ref 30.0–36.0)
MCV: 83 fL (ref 80.0–100.0)
Monocytes Absolute: 0.7 10*3/uL (ref 0.1–1.0)
Monocytes Relative: 7 %
Neutro Abs: 8.3 10*3/uL — ABNORMAL HIGH (ref 1.7–7.7)
Neutrophils Relative %: 79 %
Platelets: 240 10*3/uL (ref 150–400)
RBC: 5.46 MIL/uL (ref 4.22–5.81)
RDW: 14.1 % (ref 11.5–15.5)
WBC: 10.5 10*3/uL (ref 4.0–10.5)
nRBC: 0 % (ref 0.0–0.2)

## 2022-07-26 LAB — URINALYSIS, ROUTINE W REFLEX MICROSCOPIC
Bilirubin Urine: NEGATIVE
Glucose, UA: NEGATIVE mg/dL
Hgb urine dipstick: NEGATIVE
Ketones, ur: NEGATIVE mg/dL
Leukocytes,Ua: NEGATIVE
Nitrite: NEGATIVE
Specific Gravity, Urine: 1.021 (ref 1.005–1.030)
pH: 8 (ref 5.0–8.0)

## 2022-07-26 LAB — LIPASE, BLOOD: Lipase: 20 U/L (ref 11–51)

## 2022-07-26 MED ORDER — MORPHINE SULFATE (PF) 4 MG/ML IV SOLN
4.0000 mg | Freq: Once | INTRAVENOUS | Status: AC
  Administered 2022-07-26: 4 mg via INTRAVENOUS
  Filled 2022-07-26: qty 1

## 2022-07-26 MED ORDER — OXYCODONE-ACETAMINOPHEN 5-325 MG PO TABS: 1.0000 | ORAL_TABLET | Freq: Four times a day (QID) | ORAL | 0 refills | Status: DC | PRN

## 2022-07-26 MED ORDER — ONDANSETRON HCL 4 MG PO TABS: 4.0000 mg | ORAL_TABLET | Freq: Four times a day (QID) | ORAL | 0 refills | Status: AC

## 2022-07-26 MED ORDER — ONDANSETRON HCL 4 MG/2ML IJ SOLN
4.0000 mg | Freq: Once | INTRAMUSCULAR | Status: AC
  Administered 2022-07-26: 4 mg via INTRAVENOUS
  Filled 2022-07-26: qty 2

## 2022-07-26 MED ORDER — TAMSULOSIN HCL 0.4 MG PO CAPS: 0.4000 mg | ORAL_CAPSULE | Freq: Every day | ORAL | 0 refills | Status: DC

## 2022-07-26 MED ORDER — SODIUM CHLORIDE 0.9 % IV BOLUS
1000.0000 mL | Freq: Once | INTRAVENOUS | Status: AC
  Administered 2022-07-26: 1000 mL via INTRAVENOUS

## 2022-07-26 MED ORDER — KETOROLAC TROMETHAMINE 15 MG/ML IJ SOLN
30.0000 mg | Freq: Once | INTRAMUSCULAR | Status: AC
  Administered 2022-07-26: 30 mg via INTRAVENOUS
  Filled 2022-07-26: qty 2

## 2022-07-26 MED ORDER — HYDROMORPHONE HCL 1 MG/ML IJ SOLN
1.0000 mg | Freq: Once | INTRAMUSCULAR | Status: AC
  Administered 2022-07-26: 1 mg via INTRAVENOUS
  Filled 2022-07-26: qty 1

## 2022-07-26 NOTE — ED Notes (Signed)
His spo2 momentarily dropped to 98%; o2 placed at 2 l.p.m. his respiratory rate remains normal.

## 2022-07-26 NOTE — ED Provider Notes (Signed)
[place] [facility] AT [facility] Provider Note   CSN: [id number] Arrival date & time: 07/26/22  4132     History  Chief Complaint  Patient presents with   Abdominal Pain   Flank Pain    Benjamin Cunningham is a 57 y.o. male who presents emergency department with concerns for left flank pain onset this morning prior to arrival.  Has a history of kidney stones and notes that his pain feels similar to when he had a kidney stone.  His left flank pain is radiating to his left sided and lower abdomen.  Has associated nausea and vomiting.  Tried Tylenol at home for his symptoms.  Denies urinary symptoms, diarrhea, constipation.  The history is provided by the patient. No language interpreter was used.       Home Medications Prior to Admission medications   Medication Sig Start Date End Date Taking? Authorizing Provider  ondansetron (ZOFRAN) 4 MG tablet Take 1 tablet (4 mg total) by mouth every 6 (six) hours. 07/26/22  Yes Adetokunbo Mccadden A, PA-C  oxyCODONE-acetaminophen (PERCOCET/ROXICET) 5-325 MG tablet Take 1 tablet by mouth every 6 (six) hours as needed for severe pain. 07/26/22  Yes Greysin Medlen A, PA-C  tamsulosin (FLOMAX) 0.4 MG CAPS capsule Take 1 capsule (0.4 mg total) by mouth daily. 07/26/22  Yes Tiffiny Worthy A, PA-C  azithromycin (ZITHROMAX Z-PAK) 250 MG tablet As directed 04/16/22   Nelwyn Salisbury, MD  cetirizine (ZYRTEC) 10 MG tablet Take 10 mg by mouth daily.    [provider]  HYDROcodone bit-homatropine (HYCODAN) 5-1.5 MG/5ML syrup Take 5 mLs by mouth every 4 (four) hours as needed. 04/16/22   Nelwyn Salisbury, MD  methylPREDNISolone (MEDROL DOSEPAK) 4 MG TBPK tablet As directed 04/16/22   Nelwyn Salisbury, MD  Multiple Vitamins-Minerals (CENTRUM SILVER 50+MEN) TABS Take 1 tablet by mouth daily.    [provider]  omeprazole (PRILOSEC) 20 MG capsule Take 1 capsule (20 mg total) by mouth daily as needed (GERD). 05/01/21   Nelwyn Salisbury, MD       Allergies    Patient has no known allergies.    Review of Systems   Review of Systems  Gastrointestinal:  Positive for abdominal pain.  Genitourinary:  Positive for flank pain.  All other systems reviewed and are negative.   Physical Exam Updated Vital Signs BP (!) 156/79 (BP Location: Right Arm)   Pulse 71   Temp 97.8 F (36.6 C) (Oral)   Resp 18   Ht 5\' 8"  (1.727 m)   Wt 124.7 kg   SpO2 100%   BMI 41.81 kg/m  Physical Exam Vitals and nursing note reviewed.  Constitutional:      General: He is not in acute distress.    Appearance: He is not diaphoretic.  HENT:     Head: Normocephalic and atraumatic.     Mouth/Throat:     Pharynx: No oropharyngeal exudate.  Eyes:     General: No scleral icterus.    Conjunctiva/sclera: Conjunctivae normal.  Cardiovascular:     Rate and Rhythm: Normal rate and regular rhythm.     Pulses: Normal pulses.     Heart sounds: Normal heart sounds.  Pulmonary:     Effort: Pulmonary effort is normal. No respiratory distress.     Breath sounds: Normal breath sounds. No wheezing.  Abdominal:     General: Bowel sounds are normal.     Palpations: Abdomen is soft. There is no mass.  Tenderness: There is abdominal tenderness in the suprapubic area, left upper quadrant and left lower quadrant. There is left CVA tenderness. There is no right CVA tenderness, guarding or rebound.     Comments: Left CVA tenderness to palpation.  Tenderness to palpation noted to suprapubic, left lower quadrant, left upper quadrant.  Musculoskeletal:        General: Normal range of motion.     Cervical back: Normal range of motion and neck supple.  Skin:    General: Skin is warm and dry.  Neurological:     Mental Status: He is alert.  Psychiatric:        Behavior: Behavior normal.     ED Results / Procedures / Treatments   Labs (all labs ordered are listed, but only abnormal results are displayed) Labs Reviewed  CBC WITH DIFFERENTIAL/PLATELET - Abnormal;  Notable for the following components:      Result Value   Neutro Abs 8.3 (*)    All other components within normal limits  COMPREHENSIVE METABOLIC PANEL - Abnormal; Notable for the following components:   Glucose, Bld 133 (*)    All other components within normal limits  URINALYSIS, ROUTINE W REFLEX MICROSCOPIC - Abnormal; Notable for the following components:   Protein, ur TRACE (*)    All other components within normal limits  LIPASE, BLOOD    EKG None  Radiology CT Renal Stone Study  Result Date: 07/26/2022 CLINICAL DATA:  Abdominal/flank pain, stone suspected. Left flank radiating into the left lower quadrant this morning. EXAM: CT ABDOMEN AND PELVIS WITHOUT CONTRAST TECHNIQUE: Multidetector CT imaging of the abdomen and pelvis was performed following the standard protocol without IV contrast. RADIATION DOSE REDUCTION: This exam was performed according to the departmental dose-optimization program which includes automated exposure control, adjustment of the mA and/or kV according to patient size and/or use of iterative reconstruction technique. COMPARISON:  Abdominopelvic CT 01/23/2017. FINDINGS: Lower chest: Clear lung bases. No significant pleural or pericardial effusion. Possible small hiatal hernia, as before. Hepatobiliary: Previously demonstrated diffuse hepatic steatosis has mildly improved. No focal abnormality identified on noncontrast imaging. There is a small calcified gallstone. No evidence of gallbladder wall thickening, surrounding inflammation or biliary ductal dilatation. Pancreas: Unremarkable. No pancreatic ductal dilatation or surrounding inflammatory changes. Spleen: Normal in size without focal abnormality. Adrenals/Urinary Tract: Both adrenal glands appear normal. There is recurrent distal obstruction of the left ureter by 1-2 mm calculus at the left ureterovesical junction. There is moderate perinephric soft tissue stranding and hydronephrosis, slightly worse than that  seen on the previous study. The right kidney appears unremarkable. No other urinary tract calculi are demonstrated. The bladder appears unremarkable for its degree of distention. Stomach/Bowel: No enteric contrast administered. The stomach appears unremarkable for its degree of distension. No evidence of bowel wall thickening, distention or surrounding inflammatory change. Vascular/Lymphatic: There are no enlarged abdominal or pelvic lymph nodes. No significant vascular findings on noncontrast imaging. Reproductive: The prostate gland and seminal vesicles appear unremarkable. Other: No evidence of abdominal wall mass or hernia. No ascites. Musculoskeletal: No acute or significant osseous findings. Old left-sided rib fractures. Lower lumbar spondylosis with prominent facet arthropathy at L4-5. IMPRESSION: 1. Obstructing 1-2 mm calculus at the left ureterovesical junction with associated hydronephrosis and perinephric soft tissue stranding. 2. No other urinary tract calculi. 3. Cholelithiasis without evidence of cholecystitis or biliary ductal dilatation. 4. Hepatic steatosis. Electronically Signed   By: Carey Bullocks M.D.   On: 07/26/2022 12:10    Procedures Procedures  Medications Ordered in ED Medications  sodium chloride 0.9 % bolus 1,000 mL (0 mLs Intravenous Stopped 07/26/22 1100)  ondansetron (ZOFRAN) injection 4 mg (4 mg Intravenous Given 07/26/22 0948)  morphine (PF) 4 MG/ML injection 4 mg (4 mg Intravenous Given 07/26/22 0948)  HYDROmorphone (DILAUDID) injection 1 mg (1 mg Intravenous Given 07/26/22 1010)  ketorolac (TORADOL) 15 MG/ML injection 30 mg (30 mg Intravenous Given 07/26/22 1237)    ED Course/ Medical Decision Making/ A&P Clinical Course as of 07/26/22 1816  Sun Jul 26, 2022  1033 Pt re-evaluated and on 1 L via [place] after administration of narcotic in ED. Pt conversant in room. Discussed with patient that we will proceed with CT scan to rule out stone at this time.  [SB]  1233  Re-evaluated and noted pain is starting to return. Discussed with patient CT scan findings. Pt denies being evaluated by an urologist in the past for his stone.  [SB]  1328 Pt re-evaluated and noted improvement of symptoms with toradol. [SB]  1349 Consult to urologist, Dr. Marlou Porch who recommended pain management and outpatient follow-up. [SB]    Clinical Course User Index [SB] Phillipa Morden A, PA-C                             Medical Decision Making Amount and/or Complexity of Data Reviewed Labs: ordered. Radiology: ordered.  Risk Prescription drug management.   Pt presents with left flank pain onset this morning.  Previous history of kidney stones and notes that this pain feels similar. Vital signs stable, patient afebrile, not tachycardic or hypoxic.  On exam patient with Left CVA tenderness to palpation.  Tenderness to palpation noted to suprapubic, left lower quadrant, left upper quadrant.  Otherwise no acute cardiovascular, respiratory, abdominal spine findings.  Differential diagnosis includes acute cystitis, pyelonephritis, nephrolithiasis.    Additional history obtained:  External records from outside source obtained and reviewed including: Patient was evaluated in 2018 for similar symptoms and found to have ureteral stone at that time.  Labs:  I ordered, and personally interpreted labs.  The pertinent results include: Lipase negative CBC negative CMP negative Urinalysis negative  Imaging: I ordered imaging studies including CT renal stone study I independently visualized and interpreted imaging which showed:  1. Obstructing 1-2 mm calculus at the left ureterovesical junction  with associated hydronephrosis and perinephric soft tissue  stranding.  2. No other urinary tract calculi.  3. Cholelithiasis without evidence of cholecystitis or biliary  ductal dilatation.  4. Hepatic steatosis.   I agree with the radiologist interpretation  Medications:  I ordered  medication including IV fluids, morphine, Dilaudid, Toradol for symptom management Reevaluation of the patient after these medicines and interventions, I reevaluated the patient and found that they have improved I have reviewed the patients home medicines and have made adjustments as needed   Consultations: I requested consultation with the urologist, Dr. Marlou Porch, and discussed lab and imaging findings as well as pertinent plan - they recommend: Pain management discharge home with outpatient follow-up.   Disposition: Presenting suspicious for ureterolithiasis.  Doubt concerns at this time for pyelonephritis, nephrolithiasis, acute cystitis.. After consideration of the diagnostic results and the patients response to treatment, I feel that the patient would benefit from Discharge home.  PDMP reviewed.  Patient sent with a short course of Percocet.  Sent with a prescription for Flomax and Zofran.  Supportive care measures and strict return precautions discussed with patient at bedside.  Pt acknowledges and verbalizes understanding. Pt appears safe for discharge. Follow up as indicated in discharge paperwork.   This chart was dictated using voice recognition software, Dragon. Despite the best efforts of this provider to proofread and correct errors, errors may still occur which can change documentation meaning.  Final Clinical Impression(s) / ED Diagnoses Final diagnoses:  Ureterolithiasis    Rx / DC Orders ED Discharge Orders          Ordered    oxyCODONE-acetaminophen (PERCOCET/ROXICET) 5-325 MG tablet  Every 6 hours PRN        07/26/22 1404    tamsulosin (FLOMAX) 0.4 MG CAPS capsule  Daily        07/26/22 1404    ondansetron (ZOFRAN) 4 MG tablet  Every 6 hours        07/26/22 1404              Mayo Owczarzak A, PA-C 07/26/22 1816    Alvira Monday, MD 07/29/22 2321

## 2022-07-26 NOTE — ED Triage Notes (Signed)
Pt arrives pov, slow gait, c/o LT flank pain radiating to LLQ since early morning. Endorses n/v. Took tylenol at 0400 with no relief

## 2022-07-26 NOTE — Discharge Instructions (Signed)
It was a pleasure to take care of you tonight!  Your urinalysis was negative for infection.  Your CT scan showed concerns for a kidney stone.  You will be sent a prescription for Flomax, Zofran, Percocet, take these medications as prescribed.  Attached is information for the on-call urologist, you may call and set up a follow-up appointment regarding today's ED visit.  You may follow-up with your primary care provider regarding today's ED visit.  Return to the ED if you are experiencing increasing/worsening back pain, vomiting, fever, abdominal pain, worsening symptoms.

## 2023-09-22 ENCOUNTER — Ambulatory Visit (INDEPENDENT_AMBULATORY_CARE_PROVIDER_SITE_OTHER): Admitting: Family Medicine

## 2023-09-22 ENCOUNTER — Encounter: Payer: Self-pay | Admitting: Family Medicine

## 2023-09-22 VITALS — BP 120/70 | HR 76 | Temp 98.5°F | Wt 270.0 lb

## 2023-09-22 DIAGNOSIS — B359 Dermatophytosis, unspecified: Secondary | ICD-10-CM | POA: Diagnosis not present

## 2023-09-22 DIAGNOSIS — B356 Tinea cruris: Secondary | ICD-10-CM | POA: Diagnosis not present

## 2023-09-22 MED ORDER — KETOCONAZOLE 2 % EX CREA: 1.0000 | TOPICAL_CREAM | Freq: Three times a day (TID) | CUTANEOUS | 2 refills | Status: AC

## 2023-09-22 NOTE — Progress Notes (Signed)
   Subjective:    Patient ID: Benjamin Cunningham, male    DOB: 1965-10-17, 58 y.o.   MRN: [id number]  HPI Here for several rashes that started 3 weeks ago. First he saw a red spot on his right calf, then he developed wide areas of intense redness and soreness in the crease beneath his abdomen and in both groins. He has been applying some Triamcinolone cream he had at home, and this has eased the irritation up somewhat.    Review of Systems  Constitutional: Negative.   Respiratory: Negative.    Cardiovascular: Negative.   Skin:  Positive for rash.       Objective:   Physical Exam Constitutional:      Appearance: Normal appearance.   Cardiovascular:     Rate and Rhythm: Normal rate and regular rhythm.     Heart sounds: Normal heart sounds.  Pulmonary:     Effort: Pulmonary effort is normal.     Breath sounds: Normal breath sounds.   Skin:    Comments: The areas beneath his pannus and in both groins are red and macerated. There is also a 1.5 cm round red patch on the right calf with central clearing    Neurological:     Mental Status: He is alert.           Assessment & Plan:  He has tinea cruris and ringworm. Treat both with Ketoconazole cream TID until clear.  Garnette Olmsted, MD

## 2024-02-28 DIAGNOSIS — S6992XA Unspecified injury of left wrist, hand and finger(s), initial encounter: Secondary | ICD-10-CM | POA: Diagnosis not present
